# Patient Record
Sex: Female | Born: 1965 | Race: Black or African American | Hispanic: No | Marital: Single | Smoking: Never smoker
Health system: Southern US, Community
[De-identification: ages and names within clinical notes are randomized; demographics above are authoritative.]

## PROBLEM LIST (undated history)

## (undated) DIAGNOSIS — B20 Human immunodeficiency virus [HIV] disease: Secondary | ICD-10-CM

## (undated) DIAGNOSIS — Z21 Asymptomatic human immunodeficiency virus [HIV] infection status: Secondary | ICD-10-CM

## (undated) HISTORY — PX: MANDIBLE SURGERY: SHX707

## (undated) HISTORY — DX: Human immunodeficiency virus (HIV) disease: B20

## (undated) HISTORY — DX: Asymptomatic human immunodeficiency virus (hiv) infection status: Z21

---

## 2008-06-03 HISTORY — PX: BREAST BIOPSY: SHX20

## 2012-06-03 HISTORY — PX: UTERINE FIBROID SURGERY: SHX826

## 2018-02-20 ENCOUNTER — Other Ambulatory Visit: Payer: BLUE CROSS/BLUE SHIELD

## 2018-02-20 ENCOUNTER — Telehealth: Payer: Self-pay | Admitting: Pharmacist

## 2018-02-20 ENCOUNTER — Ambulatory Visit: Payer: BLUE CROSS/BLUE SHIELD

## 2018-02-20 ENCOUNTER — Encounter: Payer: Self-pay | Admitting: Family

## 2018-02-20 ENCOUNTER — Other Ambulatory Visit: Payer: Self-pay | Admitting: Behavioral Health

## 2018-02-20 DIAGNOSIS — B20 Human immunodeficiency virus [HIV] disease: Secondary | ICD-10-CM

## 2018-02-20 DIAGNOSIS — Z113 Encounter for screening for infections with a predominantly sexual mode of transmission: Secondary | ICD-10-CM

## 2018-02-20 DIAGNOSIS — Z79899 Other long term (current) drug therapy: Secondary | ICD-10-CM

## 2018-02-20 MED ORDER — EFAVIRENZ-EMTRICITAB-TENOFOVIR 600-200-300 MG PO TABS: 1.0000 | ORAL_TABLET | Freq: Every day | ORAL | 1 refills | Status: DC

## 2018-02-20 MED FILL — ATRIPLA 600-200-300 MG TABS: 600-200-300 | 30 days supply | Qty: 30 | Fill #0

## 2018-02-20 NOTE — Telephone Encounter (Signed)
Perfect. Thanks.

## 2018-02-20 NOTE — Telephone Encounter (Signed)
Patient came in as a new HIV transfer to get labs and meet with Juliann Pulse.  She has [employer]BS and has been out of Atripla for 10 days due to no doctor being able to send a prescription. Met with her today and will send in 30 days with 1 refill to [facility] to tie her over until she sees Marya Amsler and myself on 10/4.

## 2018-02-23 ENCOUNTER — Ambulatory Visit: Payer: Self-pay

## 2018-02-23 ENCOUNTER — Other Ambulatory Visit: Payer: Self-pay

## 2018-03-06 ENCOUNTER — Encounter: Payer: Self-pay | Admitting: Family

## 2018-03-06 ENCOUNTER — Ambulatory Visit (INDEPENDENT_AMBULATORY_CARE_PROVIDER_SITE_OTHER): Payer: BLUE CROSS/BLUE SHIELD | Admitting: Family

## 2018-03-06 VITALS — BP 127/80 | HR 71 | Temp 97.9°F | Wt 137.0 lb

## 2018-03-06 DIAGNOSIS — B20 Human immunodeficiency virus [HIV] disease: Secondary | ICD-10-CM

## 2018-03-06 MED ORDER — BICTEGRAVIR-EMTRICITAB-TENOFOV 50-200-25 MG PO TABS: 1.0000 | ORAL_TABLET | Freq: Every day | ORAL | 2 refills | Status: DC

## 2018-03-06 MED FILL — BIKTARVY 50-200-25 MG TABS: 50-200-25 | 30 days supply | Qty: 30 | Fill #0

## 2018-03-06 NOTE — Assessment & Plan Note (Signed)
Jamie Cherry has well controlled HIV disease with her current antiretroviral therapy of Atripla. She remains adherent with no adverse side effects. We discussed that there are safer options and it would be ideal to switch her from Atripla. She is in agreement and will change her to [place]. She has no signs/symptoms of opportunistic infection through history or physical exam. Awaiting her immunization records from her previous provider and she will get influenza vaccination at the pharmacy. Repeat viral load in one month. Plan for follow up office visit in 3 months or sooner if needed with blood work 1-2 weeks prior to appointment. She gets her lab work from [facility] with prescription provided.

## 2018-03-06 NOTE — Patient Instructions (Signed)
Nice to meet you.  We will get your ATRIPLA changed to BIKTARVY.  Plan for follow up in 3 months or sooner if needed with lab work 1-2 weeks prior to your appointment.  Please check your viral load after 1 month on Biktarvy.   We will try a 90 day supply at the next refill.

## 2018-03-06 NOTE — Progress Notes (Signed)
Subjective:    Patient ID: Jamie Cherry, female    DOB: Oct 16, 1965, 53 y.o.   MRN: [id number]  Chief Complaint  Patient presents with  . HIV Positive/AIDS    HPI:  Jamie Cherry is a 52 y.o. female who presents today to establish/transfer care for her HIV disease.   Jamie Cherry was initially diagnosed with HIV-1 when living in [place] in 2000 when she as experiencing chronic yeast infections. Since that time she has been on Sustiva and Truvada and about 1.5 years ago was transitioned to Atripla. Previous provider debated changing her to [facility]. She was last seen by her HIV provider in 2018 and has been taking Atripla since. She was without medication for about 2 weeks, but otherwise has been adherent to her Atripla not missing doses. She has no adverse side effects or problems obtaining the medication. She remains covered through [employer] and she works in [employer] currently. Denies fevers, chills, night sweats, headaches, changes in vision, neck pain/stiffness, nausea, diarrhea, vomiting, lesions or rashes.  Jamie Cherry completed blood work on 02/20/18 with a CD4 count of 640 and a viral load that was undetectable. Creatinine was 0.76; HLA-[id number] was negative; Quantiferon Gold was negative; she is immune to Hepatitis B; and Hepatitis C antibody was negative.   Jamie Cherry was initially diagnosed with HIV in [place] in 2000 when she was experiencing chronic yeast infections. She has been on Sustiva and Truvada in the past and then for the last year she has been Atripla. She was switched due to narrowing of therapy. She was last seen in by her HIV provider in 2018.    Allergies  Allergen Reactions  . Vicodin [Hydrocodone-Acetaminophen]     Nausea and vomiting      Outpatient Medications Prior to Visit  Medication Sig Dispense Refill  . efavirenz-emtricitabine-tenofovir (ATRIPLA) 600-200-300 MG tablet Take 1 tablet by mouth at bedtime. 30 tablet 1   No  facility-administered medications prior to visit.      Past Medical History:  Diagnosis Date  . HIV infection [facility])       Past Surgical History:  Procedure Laterality Date  . MANDIBLE SURGERY    . UTERINE FIBROID SURGERY  2014      History reviewed. No pertinent family history.    Social History   Socioeconomic History  . Marital status: Single    Spouse name: Not on file  . Number of children: Not on file  . Years of education: Not on file  . Highest education level: Not on file  Occupational History  . Occupation: Designer, fashion/clothing  . Financial resource strain: Not on file  . Food insecurity:    Worry: Not on file    Inability: Not on file  . Transportation needs:    Medical: Not on file    Non-medical: Not on file  Tobacco Use  . Smoking status: Never Smoker  . Smokeless tobacco: Never Used  Substance and Sexual Activity  . Alcohol use: Never    Frequency: Never  . Drug use: Never  . Sexual activity: Not on file  Lifestyle  . Physical activity:    Days per week: Not on file    Minutes per session: Not on file  . Stress: Not on file  Relationships  . Social connections:    Talks on phone: Not on file    Gets together: Not on file    Attends religious service: Not on file  Active member of club or organization: Not on file    Attends meetings of clubs or organizations: Not on file    Relationship status: Not on file  . Intimate partner violence:    Fear of current or ex partner: Not on file    Emotionally abused: Not on file    Physically abused: Not on file    Forced sexual activity: Not on file  Other Topics Concern  . Not on file  Social History Narrative  . Not on file      Review of Systems  Constitutional: Negative for appetite change, chills, diaphoresis, fatigue, fever and unexpected weight change.  Eyes:       Negative for acute change in vision  Respiratory: Negative for chest tightness, shortness of breath and  wheezing.   Cardiovascular: Negative for chest pain.  Gastrointestinal: Negative for diarrhea, nausea and vomiting.  Genitourinary: Negative for dysuria, pelvic pain and vaginal discharge.  Musculoskeletal: Negative for neck pain and neck stiffness.  Skin: Negative for rash.  Neurological: Negative for seizures, syncope, weakness and headaches.  Hematological: Negative for adenopathy. Does not bruise/bleed easily.  Psychiatric/Behavioral: Negative for hallucinations.         Objective:    BP 127/80   Pulse 71   Temp 97.9 F (36.6 C)   Wt 137 lb (62.1 kg)  Nursing note and vital signs reviewed.  Physical Exam  Constitutional: She is oriented to person, place, and time. She appears well-developed. No distress.  HENT:  Mouth/Throat: Oropharynx is clear and moist.  Eyes: Conjunctivae are normal.  Neck: Neck supple.  Cardiovascular: Normal rate, regular rhythm, normal heart sounds and intact distal pulses. Exam reveals no gallop and no friction rub.  No murmur heard. Pulmonary/Chest: Effort normal and breath sounds normal. No respiratory distress. She has no wheezes. She has no rales. She exhibits no tenderness.  Abdominal: Soft. Bowel sounds are normal. There is no tenderness.  Lymphadenopathy:    She has no cervical adenopathy.  Neurological: She is alert and oriented to person, place, and time.  Skin: Skin is warm and dry. No rash noted.  Psychiatric: She has a normal mood and affect. Her behavior is normal. Judgment and thought content normal.        Assessment & Plan:   Problem List Items Addressed This Visit      Other   HIV disease ([place]) - Primary    Jamie Cherry has well controlled HIV disease with her current antiretroviral therapy of Atripla. She remains adherent with no adverse side effects. We discussed that there are safer options and it would be ideal to switch her from Atripla. She is in agreement and will change her to [place]. She has no signs/symptoms of  opportunistic infection through history or physical exam. Awaiting her immunization records from her previous provider and she will get influenza vaccination at the pharmacy. Repeat viral load in one month. Plan for follow up office visit in 3 months or sooner if needed with blood work 1-2 weeks prior to appointment. She gets her lab work from [facility] with prescription provided.       Relevant Medications   bictegravir-emtricitabine-tenofovir AF (BIKTARVY) 50-200-25 MG TABS tablet       I have discontinued Jamie Cherry's efavirenz-emtricitabine-tenofovir. I am also having her start on bictegravir-emtricitabine-tenofovir AF.   Meds ordered this encounter  Medications  . bictegravir-emtricitabine-tenofovir AF (BIKTARVY) 50-200-25 MG TABS tablet    Sig: Take 1 tablet by mouth daily.  Dispense:  30 tablet    Refill:  2    Order Specific Question:   Supervising Provider    Answer:   Carlyle Basques [[id number]]     Follow-up: Return in about 3 months (around 06/06/2018), or if symptoms worsen or fail to improve.    Terri Piedra, MSN, FNP-C Nurse Practitioner [facility] Office phone: [phone] Pager: [place] number: [phone]

## 2018-03-10 ENCOUNTER — Ambulatory Visit: Payer: Self-pay

## 2018-03-10 ENCOUNTER — Encounter: Payer: Self-pay | Admitting: Family

## 2018-04-02 MED FILL — BIKTARVY 50-200-25 MG TABS: 50-200-25 | 30 days supply | Qty: 30 | Fill #1

## 2018-04-07 ENCOUNTER — Encounter: Payer: Self-pay | Admitting: Family

## 2018-04-27 MED FILL — BIKTARVY 50-200-25 MG TABS: 50-200-25 | 30 days supply | Qty: 30 | Fill #2

## 2018-05-19 ENCOUNTER — Other Ambulatory Visit: Payer: Self-pay | Admitting: Family

## 2018-05-20 ENCOUNTER — Telehealth: Payer: Self-pay

## 2018-05-20 NOTE — Telephone Encounter (Signed)
Called patient after receiving refill request for Biktarvy. Patient needs an appointment for lab work and follow-up appointment with Marcos EkeGreg Calone, Np. Patient was able to take my call and schedule appointments. Patient states she is tolerating medication well and has no complaints.  Lorenso CourierJose L Senta Kantor, [place]

## 2018-05-22 ENCOUNTER — Other Ambulatory Visit: Payer: BLUE CROSS/BLUE SHIELD

## 2018-05-22 MED FILL — BIKTARVY 50-200-25 MG TABS: 50-200-25 | 30 days supply | Qty: 30 | Fill #0

## 2018-06-17 ENCOUNTER — Other Ambulatory Visit: Payer: Self-pay | Admitting: Family

## 2018-06-26 ENCOUNTER — Ambulatory Visit: Payer: BLUE CROSS/BLUE SHIELD | Admitting: Family

## 2018-06-26 ENCOUNTER — Encounter: Payer: Self-pay | Admitting: Family

## 2018-06-26 VITALS — BP 146/92 | HR 77 | Ht 63.0 in | Wt 140.0 lb

## 2018-06-26 DIAGNOSIS — R05 Cough: Secondary | ICD-10-CM

## 2018-06-26 DIAGNOSIS — B20 Human immunodeficiency virus [HIV] disease: Secondary | ICD-10-CM | POA: Diagnosis not present

## 2018-06-26 DIAGNOSIS — R059 Cough, unspecified: Secondary | ICD-10-CM | POA: Insufficient documentation

## 2018-06-26 DIAGNOSIS — Z0000 Encounter for general adult medical examination without abnormal findings: Secondary | ICD-10-CM | POA: Diagnosis not present

## 2018-06-26 MED ORDER — PREDNISONE 20 MG PO TABS: 20.0000 mg | ORAL_TABLET | Freq: Two times a day (BID) | ORAL | 0 refills | Status: DC

## 2018-06-26 MED FILL — predniSONE 20 MG TABS: 20 | 3 days supply | Qty: 6 | Fill #0

## 2018-06-26 MED FILL — BIKTARVY 50-200-25 MG TABS: 50-200-25 | 30 days supply | Qty: 30 | Fill #0

## 2018-06-26 NOTE — Assessment & Plan Note (Signed)
Jamie Cherry has well-controlled HIV disease with good tolerance and adherence to her ART regimen of Biktarvy.  No signs/symptoms of opportunistic infection or progressive HIV disease at present.  Continue current dose of Biktarvy.  Follow-up office visit in 4 months or sooner if needed with lab work 1 to 2 weeks prior to appointment.

## 2018-06-26 NOTE — Progress Notes (Signed)
Subjective:    Patient ID: Jamie Cherry, female    DOB: [birth year], 53 y.o.   MRN: [id number]  Chief Complaint  Patient presents with  . HIV Positive/AIDS  . Cough     HPI:  Jamie Cherry is a 53 y.o. female who presents today for routine follow-up of HIV disease.  Jamie Cherry was last seen in the office on 03/06/2018 to establish/transfer care of HIV disease with good tolerance adherence to her ART regimen of Atripla and was changed to [place].  Initial blood work showed CD4 count of 640 with a viral load that was undetectable.  Most recent blood work completed on 05/22/2018 shows continued viral suppression and remaining undetectable with a CD4 count of 736.  She is due for Pneumovax, Prevnar, Menveo, dental screening, mammogram, and Pap (June 2018).  Jamie Cherry has been taking her Biktarvy as prescribed with no adverse side effects or missed doses.  She continues to remain insured through [facility] [employer] and [facility] problems. 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   She has had a persistent cough that has been going on since November. Initially seen in Urgent Care and was prescribed Tessalon pearles and Azithromycin. Chest x-ray at the time was negative. Describes the cough as non-productive and only has 1-3 episodes per day. Occasionally cough will wake her up at night. Denies fevers, chills, sweats, heart burn, abdominal pain or congestion.    Allergies  Allergen Reactions  . Vicodin [Hydrocodone-Acetaminophen]     Nausea and vomiting      Outpatient Medications Prior to Visit  Medication Sig Dispense Refill  . BIKTARVY 50-200-25 MG TABS tablet TAKE 1 TABLET BY MOUTH DAILY. 30 tablet 3   No facility-administered medications prior to visit.      Past Medical History:  Diagnosis Date  . HIV infection [facility](HCC)      Past Surgical History:    Procedure Laterality Date  . MANDIBLE SURGERY    . UTERINE FIBROID SURGERY  2014     Review of Systems  Constitutional: Negative for appetite change, chills, diaphoresis, fatigue, fever and unexpected weight change.  Eyes:       Negative for acute change in vision  Respiratory: Positive for cough. Negative for chest tightness, shortness of breath and wheezing.   Cardiovascular: Negative for chest pain.  Gastrointestinal: Negative for diarrhea, nausea and vomiting.  Genitourinary: Negative for dysuria, pelvic pain and vaginal discharge.  Musculoskeletal: Negative for neck pain and neck stiffness.  Skin: Negative for rash.  Neurological: Negative for seizures, syncope, weakness and headaches.  Hematological: Negative for adenopathy. Does not bruise/bleed easily.  Psychiatric/Behavioral: Negative for hallucinations.      Objective:    BP (!) 146/92   Pulse 77   Ht 5\' 3"  (1.6 m)   Wt 140 lb (63.5 kg)   LMP 03/03/2018 Comment: Irregular periods  BMI 24.80 kg/m  Nursing note and vital signs reviewed.  Physical Exam Constitutional:      General: She is not in acute distress.    Appearance: She is well-developed.  HENT:     Right Ear: Hearing, tympanic membrane, ear canal and external ear normal.     Left Ear: Hearing, tympanic membrane, ear canal and external ear normal.     Nose:     Right Sinus: No maxillary sinus tenderness or frontal sinus tenderness.     Left Sinus:  No maxillary sinus tenderness or frontal sinus tenderness.     Mouth/Throat:     Lips: Pink.     Mouth: Mucous membranes are moist.     Pharynx: Oropharynx is clear. Uvula midline.  Eyes:     Conjunctiva/sclera: Conjunctivae normal.  Neck:     Musculoskeletal: Neck supple.  Cardiovascular:     Rate and Rhythm: Normal rate and regular rhythm.     Heart sounds: Normal heart sounds. No murmur. No friction rub. No gallop.   Pulmonary:     Effort: Pulmonary effort is normal. No respiratory distress.      Breath sounds: Normal breath sounds. No wheezing, rhonchi or rales.  Chest:     Chest wall: No tenderness.  Abdominal:     General: Bowel sounds are normal.     Palpations: Abdomen is soft.     Tenderness: There is no abdominal tenderness.  Lymphadenopathy:     Cervical: No cervical adenopathy.  Skin:    General: Skin is warm and dry.     Findings: No rash.  Neurological:     Mental Status: She is alert and oriented to person, place, and time.  Psychiatric:        Behavior: Behavior normal.        Thought Content: Thought content normal.        Judgment: Judgment normal.        Assessment & Plan:   Problem List Items Addressed This Visit      Other   HIV disease (HCC) - Primary    Jamie Cherry has well-controlled HIV disease with good tolerance and adherence to her ART regimen of Biktarvy.  No signs/symptoms of opportunistic infection or progressive HIV disease at present.  Continue current dose of Biktarvy.  Follow-up office visit in 4 months or sooner if needed with lab work 1 to 2 weeks prior to appointment.      Cough    Symptoms of cough appear to be consistent with postviral cough syndrome or resolving bronchitis.  There is no indication for antibiotic therapy at this time.  She does not appear to have any symptoms associated with gastroesophageal reflux.  Start prednisone as needed as inflammation may be the cause of her remaining symptoms.  Follow-up if symptoms worsen or do not improve.      Health care maintenance     Declines immunizations today indicating she has had them.  Requested copy of immunization records.  Colonoscopy completed in June 2018 and up-to-date  Working on finding a gynecologist to schedule cervical cancer and breast cancer screenings.  Discussed importance of safe sexual practice to reduce acquisition and transmission of STI.  Dental screening up-to-date and recently seen in the last 3 months.          I am having Karyss Hanken start  on predniSONE. I am also having her maintain her BIKTARVY.   Meds ordered this encounter  Medications  . predniSONE (DELTASONE) 20 MG tablet    Sig: Take 1 tablet (20 mg total) by mouth 2 (two) times daily with a meal.    Dispense:  6 tablet    Refill:  0    Order Specific Question:   Supervising Provider    Answer:   Judyann Munson [[id number]]     Follow-up: Return in about 4 months (around 10/25/2018), or if symptoms worsen or fail to improve.   Marcos Eke, MSN, FNP-C Nurse Practitioner [facility] Office  phone: [phone] Pager: [phone] [facility] Main number: [phone]

## 2018-06-26 NOTE — Assessment & Plan Note (Signed)
   Declines immunizations today indicating she has had them.  Requested copy of immunization records.  Colonoscopy completed in June 2018 and up-to-date  Working on finding a gynecologist to schedule cervical cancer and breast cancer screenings.  Discussed importance of safe sexual practice to reduce acquisition and transmission of STI.  Dental screening up-to-date and recently seen in the last 3 months.

## 2018-06-26 NOTE — Patient Instructions (Signed)
Nice to see you.  We will continue your [place].   Start the prednisone for your cough which may also help with your neck.  Plan for follow up in 4 months or sooner if needed with lab work completed before appointment at [facility].  Have a great day!

## 2018-06-26 NOTE — Assessment & Plan Note (Signed)
Symptoms of cough appear to be consistent with postviral cough syndrome or resolving bronchitis.  There is no indication for antibiotic therapy at this time.  She does not appear to have any symptoms associated with gastroesophageal reflux.  Start prednisone as needed as inflammation may be the cause of her remaining symptoms.  Follow-up if symptoms worsen or do not improve.

## 2018-07-27 MED FILL — BIKTARVY 50-200-25 MG TABS: 50-200-25 | 30 days supply | Qty: 30 | Fill #1

## 2018-08-10 ENCOUNTER — Telehealth: Payer: Self-pay

## 2018-08-10 NOTE — Telephone Encounter (Signed)
Patient called today requesting to speak with Marcos Eke, Np. Patient states she has question that she would like to discuss with NP. Patient did not want to relay question to CMA. Will route message to Tammy Sours to see if he will be able to call patient back. Lorenso Courier, [place]

## 2018-08-10 NOTE — Telephone Encounter (Signed)
Returned call and received voicemail. Message left regarding attempted call.

## 2018-08-11 NOTE — Telephone Encounter (Signed)
Patient called requesting to speak to Marcos Eke directly.  Patient will not leave a message to relay to [place].  She states she is by her phone and will be able to answer today if he calls. Angeline Slim RN

## 2018-08-13 NOTE — Telephone Encounter (Signed)
Spoke with patient and answered questions.

## 2018-08-31 MED FILL — BIKTARVY 50-200-25 MG TABS: 50-200-25 | 30 days supply | Qty: 30 | Fill #2

## 2018-09-07 ENCOUNTER — Other Ambulatory Visit: Payer: Self-pay | Admitting: Obstetrics & Gynecology

## 2018-09-07 DIAGNOSIS — Z1231 Encounter for screening mammogram for malignant neoplasm of breast: Secondary | ICD-10-CM

## 2018-09-08 ENCOUNTER — Encounter: Payer: BLUE CROSS/BLUE SHIELD | Admitting: Obstetrics & Gynecology

## 2018-09-30 MED FILL — BIKTARVY 50-200-25 MG TABS: 50-200-25 | 30 days supply | Qty: 30 | Fill #3

## 2018-10-29 ENCOUNTER — Encounter: Payer: BLUE CROSS/BLUE SHIELD | Admitting: Obstetrics & Gynecology

## 2018-10-30 ENCOUNTER — Other Ambulatory Visit: Payer: Self-pay | Admitting: Family

## 2018-10-30 ENCOUNTER — Other Ambulatory Visit: Payer: Self-pay | Admitting: Pharmacist

## 2018-10-30 DIAGNOSIS — B20 Human immunodeficiency virus [HIV] disease: Secondary | ICD-10-CM

## 2018-10-30 MED ORDER — BICTEGRAVIR-EMTRICITAB-TENOFOV 50-200-25 MG PO TABS: 1.0000 | ORAL_TABLET | Freq: Every day | ORAL | 3 refills | Status: DC

## 2018-10-30 MED FILL — BIKTARVY 50-200-25 MG TABS: 50-200-25 | 30 days supply | Qty: 30 | Fill #0

## 2018-10-30 NOTE — Progress Notes (Signed)
Sending in [place] refills so [employer] can mail to patient.

## 2018-11-17 ENCOUNTER — Telehealth: Payer: Self-pay | Admitting: Radiology

## 2018-11-17 NOTE — Telephone Encounter (Signed)
Left message for patient to call [facility] for New Gyn/ Annual Exam with Dr Hulan Fray

## 2018-11-26 ENCOUNTER — Ambulatory Visit
Admission: EM | Admit: 2018-11-26 | Discharge: 2018-11-26 | Disposition: A | Discharge status: Home / Self Care | Payer: BC Managed Care – PPO | Attending: Emergency Medicine | Admitting: Emergency Medicine

## 2018-11-26 ENCOUNTER — Encounter: Payer: Self-pay | Admitting: Emergency Medicine

## 2018-11-26 ENCOUNTER — Other Ambulatory Visit: Payer: Self-pay

## 2018-11-26 DIAGNOSIS — R2231 Localized swelling, mass and lump, right upper limb: Secondary | ICD-10-CM

## 2018-11-26 DIAGNOSIS — L02411 Cutaneous abscess of right axilla: Secondary | ICD-10-CM

## 2018-11-26 MED FILL — BIKTARVY 50-200-25 MG TABS: 50-200-25 | 30 days supply | Qty: 30 | Fill #1

## 2018-11-26 NOTE — ED Provider Notes (Signed)
HPI  SUBJECTIVE:  Jamie Cherry is a 53 y.o. female who presents with painful,  mass in her right axilla starting 5 days ago.  She describes the pain as throbbing, constant soreness, but states that it is getting much better.  She states that it was erythematous and soft in the middle initially, and now has turned into a small, hard mass.  States that erythema has resolved, and that it is getting much smaller.  She does shave her axilla.  No trauma, bug bite, fevers, body aches, nausea, vomiting, expressible purulent drainage, other axillary masses.  No breast masses, skin changes, nipple discharge.  She does do breast self exams.  No aggravating or alleviating factors.  She has not tried anything for this.  She has a past medical history HIV, CD4 700, viral load undetectable.  No history of MRSA, diabetes, breast cancer although she has a mammogram scheduled for next week.  No history of abscess, hirdadenitis suppurativa.  Family history significant for sister with breast cancer.  PMD: None.   Past Medical History:  Diagnosis Date  . HIV infection [facility])     Past Surgical History:  Procedure Laterality Date  . MANDIBLE SURGERY    . UTERINE FIBROID SURGERY  2014    Family History  Problem Relation Age of Onset  . Hypertension Mother   . Heart disease Father   . Hypertension Father     Social History   Tobacco Use  . Smoking status: Never Smoker  . Smokeless tobacco: Never Used  Substance Use Topics  . Alcohol use: Never    Frequency: Never  . Drug use: Never    No current facility-administered medications for this encounter.   Current Outpatient Medications:  .  bictegravir-emtricitabine-tenofovir AF (BIKTARVY) 50-200-25 MG TABS tablet, Take 1 tablet by mouth daily., Disp: 30 tablet, Rfl: 3  Allergies  Allergen Reactions  . Vicodin [Hydrocodone-Acetaminophen]     Nausea and vomiting     ROS  As noted in HPI.   Physical Exam  BP (!) 143/86 (BP Location: Right Arm)    Pulse 65   Temp 98.1 F (36.7 C) (Oral)   Resp 18   Ht 5\' 3"  (1.6 m)   Wt 67.1 kg   LMP 03/03/2018 Comment: Irregular periods  SpO2 100%   BMI 26.22 kg/m   Constitutional: Well developed, well nourished, no acute distress Eyes:  EOMI, conjunctiva normal bilaterally HENT: Normocephalic, atraumatic,mucus membranes moist Respiratory: Normal inspiratory effort Rest: Normal appearance, breast symmetric.  No palpable masses bilaterally.  No skin changes, expressible nipple drainage bilaterally Cardiovascular: Normal rate GI: nondistended skin: Right axilla normal skin.  No erythema.  No evidence of trauma.  +0.5 cm tender mass along the inferior part of the axilla.  No expressible purulent drainage.  No other axillary masses. Lymph: No supraclavicular lymphadenopathy. Musculoskeletal: no deformities Neurologic: Alert & oriented x 3, no focal neuro deficits Psychiatric: Speech and behavior appropriate   ED Course   Medications - No data to display  No orders of the defined types were placed in this encounter.   No results found for this or any previous visit (from the past 24 hour(s)). No results found.  ED Clinical Impression  1. Mass of right axilla   2. Abscess of axilla, right      ED Assessment/Plan  Given the history, I suspect a healing axillary abscess although it could be an lymph node.  However I think this is much less likely.  There were  no other appreciable axillary or supraclavicular, breast masses bilaterally.  She has a mammogram scheduled for next week.  We talked about antibiotics, but given the absence of erythema, fact that it is getting significantly better, we decided to forego them today.  She is to continue warm compresses.  Follow-up with a primary care physician of her choice, providing primary care list. Discussed  MDM, treatment plan, and plan for follow-up with patient.patient agrees with plan.   No orders of the defined types were placed in this  encounter.   *This clinic note was created using Dragon dictation software. Therefore, there may be occasional mistakes despite careful proofreading.   ?   Domenick GongMortenson, Jamie Navarrette, MD 11/26/18 1140

## 2018-11-26 NOTE — Discharge Instructions (Signed)
Suspect that this was an abscess rather than a lymph node.  Does not seem to be anything to do today.  It does not need drainage, and as we discussed, I do not think that this needs antibiotics.  However, your mammogram next week will give [place] more information as to whether it could be a lymph node.  You may do warm compresses to the area to help speed healing.  Return here if your symptoms return.  Follow-up with a primary care physician as soon as you possibly can.  See list below.  Here is a list of primary care providers who are taking new patients:  Dr. Otilio Miu, Dr. Adline Potter [address] [place] [address] [place] [address]  [phone]  [facility] [address] [place], [place] [address] [phone]  [facility] [place]  [phone] [place], [place] [address]  Here are clinics/ other resources who will see you if you do not have insurance. Some have certain criteria that you must meet. Call them and find out what they are:  [facility]: [address], [place], [place] [address] Phone: [phone] Hours: First and Third Saturdays of each Month, 9 a.m. - 1 p.m.  [facility]: [address] [place], [place], [place] [address] Phone: [phone] Hours: Tuesday, 4 p.m. - 8 p.m. Thursday, 1 p.m. - 8 p.m. Wednesday, 9 a.m. - [facility] [address] [place], [place] [address] Phone: [phone] Pharmacy Phone Number: [phone] Dental Phone Number: [phone] [place] Help: [phone]  Dental Hours: Monday - Thursday, 8 a.m. - 6 p.m.  [facility] [address] [place], [place] [address] Phone: [phone] Pharmacy Phone Number: [phone] [facility] Insurance Help: [phone]  [facility] [address] [place], [place] [address] Phone:  [phone] Pharmacy Phone Number: [phone] [facility] Insurance Help: [phone]  [facility] [address] [place], [place] [address] Phone: [phone] [facility] Insurance Help: [phone]   [place] [place],  [address] Phone: [phone]  Go to www.goodrx.com to look up your medications. This will give you a list of where you can find your prescriptions at the most affordable prices. Or ask the pharmacist what the cash price is, or if they have any other discount programs available to help make your medication more affordable. This can be less expensive than what you would pay with insurance.

## 2018-11-26 NOTE — ED Triage Notes (Signed)
Pt c/o abscess under right arm. Started about 4 days ago. No drainage. No fevers

## 2018-12-03 ENCOUNTER — Other Ambulatory Visit: Payer: Self-pay

## 2018-12-03 ENCOUNTER — Ambulatory Visit
Admission: RE | Admit: 2018-12-03 | Discharge: 2018-12-03 | Disposition: A | Discharge status: Home / Self Care | Payer: BC Managed Care – PPO | Source: Ambulatory Visit | Attending: Obstetrics & Gynecology | Admitting: Obstetrics & Gynecology

## 2018-12-03 DIAGNOSIS — Z1231 Encounter for screening mammogram for malignant neoplasm of breast: Secondary | ICD-10-CM | POA: Insufficient documentation

## 2018-12-21 ENCOUNTER — Telehealth: Payer: Self-pay

## 2018-12-24 ENCOUNTER — Encounter: Payer: Self-pay | Admitting: Family

## 2018-12-25 MED FILL — BIKTARVY 50-200-25 MG TABS: 50-200-25 | 30 days supply | Qty: 30 | Fill #2

## 2018-12-31 NOTE — Telephone Encounter (Signed)
Patient checking for refill location.

## 2019-01-12 ENCOUNTER — Other Ambulatory Visit: Payer: Self-pay

## 2019-01-12 ENCOUNTER — Encounter: Payer: Self-pay | Admitting: Family

## 2019-01-12 ENCOUNTER — Ambulatory Visit (INDEPENDENT_AMBULATORY_CARE_PROVIDER_SITE_OTHER): Payer: BC Managed Care – PPO | Admitting: Family

## 2019-01-12 DIAGNOSIS — Z0000 Encounter for general adult medical examination without abnormal findings: Secondary | ICD-10-CM

## 2019-01-12 DIAGNOSIS — B20 Human immunodeficiency virus [HIV] disease: Secondary | ICD-10-CM

## 2019-01-12 DIAGNOSIS — R05 Cough: Secondary | ICD-10-CM

## 2019-01-12 DIAGNOSIS — R059 Cough, unspecified: Secondary | ICD-10-CM

## 2019-01-12 MED ORDER — BIKTARVY 50-200-25 MG PO TABS: 1.0000 | ORAL_TABLET | Freq: Every day | ORAL | 2 refills | Status: DC

## 2019-01-12 NOTE — Assessment & Plan Note (Signed)
Continues to have occasional cough with differentials including gastroesophageal reflux or possible sleep apnea.  Recommend over-the-counter medications as needed for symptom relief and supportive care as well as dietary changes.  If symptoms worsen or do not improve consider referral to gastroenterology or for sleep study.

## 2019-01-12 NOTE — Patient Instructions (Signed)
Nice to speak with you today.  Please continue take your Independence as prescribed daily.  Refills of medication have been sent to the pharmacy.  Recommend trying antacids when having episodes of coughing as this may be related to gastroesophageal reflux.  If not improved we may need to consider referral to pulmonology or gastroenterology for further evaluation.  Recommend vaccination for influenza in September when available.  Plan for follow-up in 6 months or sooner if needed with lab work 1 to 2 weeks prior to appointment.

## 2019-01-12 NOTE — Progress Notes (Signed)
Subjective:    Patient ID: Jamie Cherry, female    DOB: Aug 31, 1965, 53 y.o.   MRN: [id number]  Chief Complaint  Patient presents with  . HIV Positive/AIDS     Virtual Visit via Telephone Note   I connected with [facility] on 01/12/2019 at 4:39 pm by telephone and verified that I am speaking with the correct person using two identifiers.   I discussed the limitations, risks, security and privacy concerns of performing an evaluation and management service by telephone and the availability of in person appointments. I also discussed with the patient that there may be a patient responsible charge related to this service. The patient expressed understanding and agreed to proceed.   HPI:  Jamie Cherry is a 53 y.o. female with HIV disease who was last seen in the office on 06/26/2018 with good adherence and tolerance to her ART of Biktarvy. Most recent blood work completed on 12/24/2018 shows a viral load that is undetectable with CD4 count of 789.  Kidney function, liver function, electrolytes within normal ranges.  RPR was nonreactive.  Lipid profile with LDL of 144, triglycerides 92, and HDL of 72.  Jamie Cherry continues to take her Biktarvy as prescribed no adverse side effects or missed doses.  Overall feeling very well although does have the occasional cough at times. Denies fevers, chills, night sweats, headaches, changes in vision, neck pain/stiffness, nausea, diarrhea, vomiting, lesions or rashes.  Jamie Cherry continues to have covered through [facility] and has no problems obtaining her medication from the pharmacy which are currently mailed.  Denies feelings of being down, depressed, or hopeless recently.  Continues to work from home.  No recreational or illicit drug use, tobacco use, or alcohol consumption.  Not currently sexually active at this time.   Allergies  Allergen Reactions  . Vicodin [Hydrocodone-Acetaminophen]     Nausea and vomiting      Outpatient  Medications Prior to Visit  Medication Sig Dispense Refill  . bictegravir-emtricitabine-tenofovir AF (BIKTARVY) 50-200-25 MG TABS tablet Take 1 tablet by mouth daily. 30 tablet 3   No facility-administered medications prior to visit.      Past Medical History:  Diagnosis Date  . HIV infection [facility])      Past Surgical History:  Procedure Laterality Date  . BREAST BIOPSY Left 2010   neg  . MANDIBLE SURGERY    . UTERINE FIBROID SURGERY  2014       Review of Systems  Constitutional: Negative for appetite change, chills, diaphoresis, fatigue, fever and unexpected weight change.  Eyes:       Negative for acute change in vision  Respiratory: Negative for chest tightness, shortness of breath and wheezing.   Cardiovascular: Negative for chest pain.  Gastrointestinal: Negative for diarrhea, nausea and vomiting.  Genitourinary: Negative for dysuria, pelvic pain and vaginal discharge.  Musculoskeletal: Negative for neck pain and neck stiffness.  Skin: Negative for rash.  Neurological: Negative for seizures, syncope, weakness and headaches.  Hematological: Negative for adenopathy. Does not bruise/bleed easily.  Psychiatric/Behavioral: Negative for hallucinations.      Objective:    Nursing note and vital signs reviewed.    [name] sounds to be doing very well and is a pleasure to speak with.  Remaining physical exam deferred due to phone visit. Assessment & Plan:   Problem List Items Addressed This Visit      Other   HIV disease ([place])    Jamie Cherry has well-controlled HIV disease with good  adherence and tolerance to her ART regimen of Biktarvy.  No signs/symptoms of opportunistic infection or progressive HIV disease.  She has no problems obtaining her medication from the pharmacy.  Continue current dose of Biktarvy.  Plan for follow-up in 6 months or sooner if needed with lab work 1 to 2 weeks prior to appointment through [facility].      Relevant Medications    bictegravir-emtricitabine-tenofovir AF (BIKTARVY) 50-200-25 MG TABS tablet   Cough    Continues to have occasional cough with differentials including gastroesophageal reflux or possible sleep apnea.  Recommend over-the-counter medications as needed for symptom relief and supportive care as well as dietary changes.  If symptoms worsen or do not improve consider referral to gastroenterology or for sleep study.      Health care maintenance - Primary     Dental screening remains up-to-date  Working on gynecology appointment to complete cervical and breast cancer screenings.  Discussed importance of safe sexual practice to reduce risk of acquisition/transmission of STI.          I am having Jamie Cherry maintain her Biktarvy.   Meds ordered this encounter  Medications  . bictegravir-emtricitabine-tenofovir AF (BIKTARVY) 50-200-25 MG TABS tablet    Sig: Take 1 tablet by mouth daily.    Dispense:  90 tablet    Refill:  2    Order Specific Question:   Supervising Provider    Answer:   Judyann MunsonSNIDER, CYNTHIA [phone][[id number]]     I discussed the assessment and treatment plan with the patient. The patient was provided an opportunity to ask questions and all were answered. The patient agreed with the plan and demonstrated an understanding of the instructions.   The patient was advised to call back or seek an in-person evaluation if the symptoms worsen or if the condition fails to improve as anticipated.   I provided  18  minutes of non-face-to-face time during this encounter.  Follow-up: Return in about 6 months (around 07/15/2019).   Marcos EkeGreg Calone, MSN, FNP-C Nurse Practitioner [facility] Main number: [phone]

## 2019-01-12 NOTE — Assessment & Plan Note (Signed)
   Dental screening remains up-to-date  Working on gynecology appointment to complete cervical and breast cancer screenings.  Discussed importance of safe sexual practice to reduce risk of acquisition/transmission of STI.

## 2019-01-12 NOTE — Assessment & Plan Note (Signed)
Ms. Pestka has well-controlled HIV disease with good adherence and tolerance to her ART regimen of Biktarvy.  No signs/symptoms of opportunistic infection or progressive HIV disease.  She has no problems obtaining her medication from the pharmacy.  Continue current dose of Biktarvy.  Plan for follow-up in 6 months or sooner if needed with lab work 1 to 2 weeks prior to appointment through [employer].

## 2019-01-21 MED FILL — BIKTARVY 50-200-25 MG TABS: 50-200-25 | 30 days supply | Qty: 30 | Fill #0

## 2019-02-23 MED FILL — BIKTARVY 50-200-25 MG TABS: 50-200-25 | 30 days supply | Qty: 30 | Fill #1

## 2019-03-19 MED FILL — BIKTARVY 50-200-25 MG TABS: 50-200-25 | 30 days supply | Qty: 30 | Fill #2

## 2019-04-19 MED FILL — BIKTARVY 50-200-25 MG TABS: 50-200-25 | 30 days supply | Qty: 30 | Fill #3

## 2019-05-31 MED FILL — BIKTARVY 50-200-25 MG TABS: 50-200-25 | 30 days supply | Qty: 30 | Fill #4

## 2019-06-28 MED FILL — BIKTARVY 50-200-25 MG TABS: 50-200-25 | 30 days supply | Qty: 30 | Fill #5

## 2019-06-29 ENCOUNTER — Telehealth: Payer: Self-pay

## 2019-06-29 NOTE — Telephone Encounter (Signed)
Patient requesting hard script for labs she will have her labs drawn at [facility]. Patient scheduled for future office visit in early March. Patient will come by the office and pick up script [facility]

## 2019-07-26 MED FILL — BIKTARVY 50-200-25 MG TABS: 50-200-25 | 30 days supply | Qty: 30 | Fill #6

## 2019-08-06 ENCOUNTER — Other Ambulatory Visit: Payer: Self-pay

## 2019-08-06 ENCOUNTER — Encounter: Payer: Self-pay | Admitting: Family

## 2019-08-06 ENCOUNTER — Ambulatory Visit: Payer: BC Managed Care – PPO | Admitting: Family

## 2019-08-06 VITALS — BP 158/94 | HR 75 | Wt 153.0 lb

## 2019-08-06 DIAGNOSIS — Z0000 Encounter for general adult medical examination without abnormal findings: Secondary | ICD-10-CM | POA: Diagnosis not present

## 2019-08-06 DIAGNOSIS — B20 Human immunodeficiency virus [HIV] disease: Secondary | ICD-10-CM

## 2019-08-06 MED ORDER — VALACYCLOVIR HCL 1 G PO TABS: ORAL_TABLET | ORAL | 1 refills | Status: DC

## 2019-08-06 MED ORDER — BIKTARVY 50-200-25 MG PO TABS: 1.0000 | ORAL_TABLET | Freq: Every day | ORAL | 5 refills | Status: DC

## 2019-08-06 MED FILL — valACYclovir HCL 1 GM TABS: 1 | 15 days supply | Qty: 30 | Fill #0

## 2019-08-06 NOTE — Assessment & Plan Note (Signed)
Jamie Cherry has well-controlled HIV disease with good adherence and tolerance to her ART regimen of Biktarvy.  No signs/symptoms of opportunistic infection or progressive HIV disease.  She has no problems obtaining her medication from the pharmacy.  We reviewed her lab work and discussed the plan of care.  Introduced the long-acting injectable medications as well as SOLAR study information.  Continue current dose of Biktarvy.  Plan for follow-up in 6 months or sooner if needed with lab work 1 to 2 weeks prior to appointment.

## 2019-08-06 NOTE — Assessment & Plan Note (Signed)
   Check vaccination records.  Discussed importance of safe sexual practice to reduce risk of STI.  Condoms provided.

## 2019-08-06 NOTE — Patient Instructions (Addendum)
Nice to see you.  We will send refills of your Biktarvy and Valtrex to the pharmacy.  Please review the information on the Solar study and [place].  [email or web address]  Plan for follow up in 6 months or sooner if needed with lab work at [facility].   Have a great day and stay safe!

## 2019-08-06 NOTE — Progress Notes (Signed)
Subjective:    Patient ID: Jamie Cherry, female    DOB: 1965/12/30, 54 y.o.   MRN: [id number]  Chief Complaint  Patient presents with  . HIV Positive/AIDS     HPI:  Jamie Cherry is a 54 y.o. female with HIV disease who was last seen in the office on 01/12/2019 with good adherence and tolerance to her ART regimen of Biktarvy.  Her viral load was undetectable with CD4 count of 789. Most recent blood work completed on 07/09/19 with CD4 count of 666 with a viral load that is undetectable. Kidney function, liver function and electrolytes within normal ranges.   Jamie Cherry continues to take her Biktarvy as prescribed with no adverse side effects or missed doses since her last office visit.  Overall feeling well today with no new concerns/complaints.  Would like a refill of her valacyclovir. Denies fevers, chills, night sweats, headaches, changes in vision, neck pain/stiffness, nausea, diarrhea, vomiting, lesions or rashes.  Jamie Cherry has no problems obtaining her medication from the pharmacy remains covered through [facility].  No recent feelings of being down, depressed, or hopeless.  No recreational or illicit drug use, tobacco use, or alcohol consumption.  Continues to work full-time.       Allergies  Allergen Reactions  . Vicodin [Hydrocodone-Acetaminophen]     Nausea and vomiting      Outpatient Medications Prior to Visit  Medication Sig Dispense Refill  . bictegravir-emtricitabine-tenofovir AF (BIKTARVY) 50-200-25 MG TABS tablet Take 1 tablet by mouth daily. 90 tablet 2   No facility-administered medications prior to visit.     Past Medical History:  Diagnosis Date  . HIV infection [facility])      Past Surgical History:  Procedure Laterality Date  . BREAST BIOPSY Left 2010   neg  . MANDIBLE SURGERY    . UTERINE FIBROID SURGERY  2014       Review of Systems  Constitutional: Negative for appetite change, chills, diaphoresis, fatigue, fever and  unexpected weight change.  Eyes:       Negative for acute change in vision  Respiratory: Negative for chest tightness, shortness of breath and wheezing.   Cardiovascular: Negative for chest pain.  Gastrointestinal: Negative for diarrhea, nausea and vomiting.  Genitourinary: Negative for dysuria, pelvic pain and vaginal discharge.  Musculoskeletal: Negative for neck pain and neck stiffness.  Skin: Negative for rash.  Neurological: Negative for seizures, syncope, weakness and headaches.  Hematological: Negative for adenopathy. Does not bruise/bleed easily.  Psychiatric/Behavioral: Negative for hallucinations.      Objective:    BP (!) 158/94   Pulse 75   Wt 153 lb (69.4 kg)   BMI 27.10 kg/m  Nursing note and vital signs reviewed.  Physical Exam Constitutional:      General: She is not in acute distress.    Appearance: She is well-developed.  Eyes:     Conjunctiva/sclera: Conjunctivae normal.  Cardiovascular:     Rate and Rhythm: Normal rate and regular rhythm.     Heart sounds: Normal heart sounds. No murmur. No friction rub. No gallop.   Pulmonary:     Effort: Pulmonary effort is normal. No respiratory distress.     Breath sounds: Normal breath sounds. No wheezing or rales.  Chest:     Chest wall: No tenderness.  Abdominal:     General: Bowel sounds are normal.     Palpations: Abdomen is soft.     Tenderness: There is no abdominal tenderness.  Musculoskeletal:  Cervical back: Neck supple.  Lymphadenopathy:     Cervical: No cervical adenopathy.  Skin:    General: Skin is warm and dry.     Findings: No rash.  Neurological:     Mental Status: She is alert and oriented to person, place, and time.  Psychiatric:        Behavior: Behavior normal.        Thought Content: Thought content normal.        Judgment: Judgment normal.      Depression screen [facility] 2/9 08/06/2019 06/26/2018 03/06/2018  Decreased Interest 0 0 0  Down, Depressed, Hopeless 0 0 0  PHQ - 2 Score 0  0 0       Assessment & Plan:    Patient Active Problem List   Diagnosis Date Noted  . Cough 06/26/2018  . Health care maintenance 06/26/2018  . HIV disease ([place]) 03/06/2018     Problem List Items Addressed This Visit      Other   HIV disease ([place]) - Primary    Jamie Cherry has well-controlled HIV disease with good adherence and tolerance to her ART regimen of Biktarvy.  No signs/symptoms of opportunistic infection or progressive HIV disease.  She has no problems obtaining her medication from the pharmacy.  We reviewed her lab work and discussed the plan of care.  Introduced the long-acting injectable medications as well as SOLAR study information.  Continue current dose of Biktarvy.  Plan for follow-up in 6 months or sooner if needed with lab work 1 to 2 weeks prior to appointment.      Relevant Medications   bictegravir-emtricitabine-tenofovir AF (BIKTARVY) 50-200-25 MG TABS tablet   valACYclovir (VALTREX) 1000 MG tablet   Health care maintenance     Check vaccination records.  Discussed importance of safe sexual practice to reduce risk of STI.  Condoms provided.          I am having Jamie Cherry start on valACYclovir. I am also having her maintain her Biktarvy.   Meds ordered this encounter  Medications  . bictegravir-emtricitabine-tenofovir AF (BIKTARVY) 50-200-25 MG TABS tablet    Sig: Take 1 tablet by mouth daily.    Dispense:  30 tablet    Refill:  5    Order Specific Question:   Supervising Provider    Answer:   Carlyle Basques [[id number]]  . valACYclovir (VALTREX) 1000 MG tablet    Sig: Take 1 tablet by mouth twice daily for 7 days as needed for outbreaks    Dispense:  30 tablet    Refill:  1    Order Specific Question:   Supervising Provider    Answer:   Carlyle Basques [[id number]]     Follow-up: Return in about 6 months (around 02/06/2020), or if symptoms worsen or fail to improve.   Terri Piedra, MSN, FNP-C Nurse Practitioner [facility] for Infectious  Disease [place] number: [phone]

## 2019-08-23 MED FILL — BIKTARVY 50-200-25 MG TABS: 50-200-25 | 30 days supply | Qty: 30 | Fill #0

## 2019-09-30 MED FILL — BIKTARVY 50-200-25 MG TABS: 50-200-25 | 30 days supply | Qty: 30 | Fill #7

## 2019-10-18 ENCOUNTER — Telehealth: Payer: Self-pay

## 2019-10-18 NOTE — Telephone Encounter (Signed)
Patient called office today requesting to speak with Marcos Eke, FNP with questions. Would like to know what his advise is on new mask guidelines. Would also like to speak with FNP regarding reaction she was told for Ibuprofen and Biktarvy.   Lorenso Courier, [place]

## 2019-10-20 NOTE — Telephone Encounter (Signed)
Patient called to follow up on previous call to provider. Will forward to provider.   Merla Sawka Loyola Mast, RN

## 2019-10-21 NOTE — Telephone Encounter (Signed)
Spoke with Jamie Cherry regarding her questions about the updated mask policy and also interactions between University and ibuprofen.

## 2019-11-02 MED FILL — BIKTARVY 50-200-25 MG TABS: 50-200-25 | 30 days supply | Qty: 30 | Fill #8

## 2019-11-29 MED FILL — BIKTARVY 50-200-25 MG TABS: 50-200-25 | 30 days supply | Qty: 30 | Fill #1

## 2020-01-17 ENCOUNTER — Other Ambulatory Visit: Payer: Self-pay | Admitting: Internal Medicine

## 2020-01-17 DIAGNOSIS — Z1231 Encounter for screening mammogram for malignant neoplasm of breast: Secondary | ICD-10-CM

## 2020-01-18 MED FILL — BIKTARVY 50-200-25 MG TABS: 50-200-25 | 30 days supply | Qty: 30 | Fill #2

## 2020-02-03 ENCOUNTER — Encounter (INDEPENDENT_AMBULATORY_CARE_PROVIDER_SITE_OTHER): Payer: Self-pay

## 2020-02-03 ENCOUNTER — Other Ambulatory Visit: Payer: Self-pay

## 2020-02-03 ENCOUNTER — Ambulatory Visit
Admission: RE | Admit: 2020-02-03 | Discharge: 2020-02-03 | Disposition: A | Discharge status: Home / Self Care | Payer: BC Managed Care – PPO | Source: Ambulatory Visit | Attending: Internal Medicine | Admitting: Internal Medicine

## 2020-02-03 DIAGNOSIS — Z1231 Encounter for screening mammogram for malignant neoplasm of breast: Secondary | ICD-10-CM | POA: Diagnosis not present

## 2020-02-11 ENCOUNTER — Ambulatory Visit: Payer: BC Managed Care – PPO | Admitting: Family

## 2020-02-18 MED FILL — BIKTARVY 50-200-25 MG TABS: 50-200-25 | 30 days supply | Qty: 30 | Fill #3

## 2020-03-09 ENCOUNTER — Other Ambulatory Visit: Payer: Self-pay

## 2020-03-09 ENCOUNTER — Ambulatory Visit (INDEPENDENT_AMBULATORY_CARE_PROVIDER_SITE_OTHER): Payer: BC Managed Care – PPO | Admitting: Family

## 2020-03-09 ENCOUNTER — Other Ambulatory Visit (HOSPITAL_COMMUNITY): Payer: Self-pay | Admitting: Family

## 2020-03-09 ENCOUNTER — Encounter: Payer: Self-pay | Admitting: Family

## 2020-03-09 VITALS — HR 78 | Wt 149.0 lb

## 2020-03-09 DIAGNOSIS — Z79899 Other long term (current) drug therapy: Secondary | ICD-10-CM

## 2020-03-09 DIAGNOSIS — B20 Human immunodeficiency virus [HIV] disease: Secondary | ICD-10-CM

## 2020-03-09 DIAGNOSIS — Z113 Encounter for screening for infections with a predominantly sexual mode of transmission: Secondary | ICD-10-CM

## 2020-03-09 DIAGNOSIS — Z0000 Encounter for general adult medical examination without abnormal findings: Secondary | ICD-10-CM | POA: Diagnosis not present

## 2020-03-09 MED ORDER — BIKTARVY 50-200-25 MG PO TABS: 1.0000 | ORAL_TABLET | Freq: Every day | ORAL | 5 refills | Status: DC

## 2020-03-09 NOTE — Progress Notes (Signed)
Subjective:    Patient ID: Jamie Cherry, female    DOB: 1966-05-19, 54 y.o.   MRN: [id number]  Chief Complaint  Patient presents with  . Follow-up    No questions or concerns     HPI:  Jamie Cherry is a 54 y.o. female with HIV disease was last seen in the office on 08/06/2019 with good adherence and tolerance to her ART regimen of Biktarvy.  Viral load at the time was undetectable with CD4 count of 666.  Here today for routine follow-up.  Jamie Cherry continues to take her Biktarvy daily as prescribed with no adverse side effects or missed doses since her last office visit.  Overall feeling well today with no new concerns/complaints.  She is interested in receiving her booster shot for COVID-19. Denies fevers, chills, night sweats, headaches, changes in vision, neck pain/stiffness, nausea, diarrhea, vomiting, lesions or rashes.  Jamie Cherry has no problems obtaining her medication from the pharmacy and remains covered through [facility].  Denies feelings of being down, depressed, or hopeless.  No recreational or illicit drug use, tobacco use, or alcohol consumption.  Declines condoms today.  Mammogram is up-to-date.  Has received shingles vaccination.  Due for routine dental care.   Allergies  Allergen Reactions  . Vicodin [Hydrocodone-Acetaminophen]     Nausea and vomiting      Outpatient Medications Prior to Visit  Medication Sig Dispense Refill  . bictegravir-emtricitabine-tenofovir AF (BIKTARVY) 50-200-25 MG TABS tablet Take 1 tablet by mouth daily. 30 tablet 5  . valACYclovir (VALTREX) 1000 MG tablet Take 1 tablet by mouth twice daily for 7 days as needed for outbreaks (Patient not taking: Reported on 03/09/2020) 30 tablet 1   No facility-administered medications prior to visit.     Past Medical History:  Diagnosis Date  . HIV infection [facility])      Past Surgical History:  Procedure Laterality Date  . BREAST BIOPSY Left 2010   neg  . MANDIBLE SURGERY      . UTERINE FIBROID SURGERY  2014    Review of Systems  Constitutional: Negative for appetite change, chills, diaphoresis, fatigue, fever and unexpected weight change.  Eyes:       Negative for acute change in vision  Respiratory: Negative for chest tightness, shortness of breath and wheezing.   Cardiovascular: Negative for chest pain.  Gastrointestinal: Negative for diarrhea, nausea and vomiting.  Genitourinary: Negative for dysuria, pelvic pain and vaginal discharge.  Musculoskeletal: Negative for neck pain and neck stiffness.  Skin: Negative for rash.  Neurological: Negative for seizures, syncope, weakness and headaches.  Hematological: Negative for adenopathy. Does not bruise/bleed easily.  Psychiatric/Behavioral: Negative for hallucinations.      Objective:    Pulse 78   Wt 149 lb (67.6 kg)   LMP 03/03/2018 Comment: Irregular periods  SpO2 100%   BMI 26.39 kg/m  Nursing note and vital signs reviewed.  Physical Exam Constitutional:      General: She is not in acute distress.    Appearance: She is well-developed.  Eyes:     Conjunctiva/sclera: Conjunctivae normal.  Cardiovascular:     Rate and Rhythm: Normal rate and regular rhythm.     Heart sounds: Normal heart sounds. No murmur heard.  No friction rub. No gallop.   Pulmonary:     Effort: Pulmonary effort is normal. No respiratory distress.     Breath sounds: Normal breath sounds. No wheezing or rales.  Chest:     Chest wall: No tenderness.  Abdominal:     General: Bowel sounds are normal.     Palpations: Abdomen is soft.     Tenderness: There is no abdominal tenderness.  Musculoskeletal:     Cervical back: Neck supple.  Lymphadenopathy:     Cervical: No cervical adenopathy.  Skin:    General: Skin is warm and dry.     Findings: No rash.  Neurological:     Mental Status: She is alert and oriented to person, place, and time.  Psychiatric:        Behavior: Behavior normal.        Thought Content: Thought  content normal.        Judgment: Judgment normal.      Depression screen [facility] 2/9 03/09/2020 08/06/2019 06/26/2018 03/06/2018  Decreased Interest 0 0 0 0  Down, Depressed, Hopeless 0 0 0 0  PHQ - 2 Score 0 0 0 0       Assessment & Plan:    Patient Active Problem List   Diagnosis Date Noted  . Cough 06/26/2018  . Health care maintenance 06/26/2018  . HIV disease (HCC) 03/06/2018     Problem List Items Addressed This Visit      Other   HIV disease (HCC)    [name] continues to have well-controlled HIV disease with good adherence and tolerance to her ART regimen of Biktarvy.  No signs/symptoms of opportunistic infection or progressive HIV disease.  We reviewed previous lab work and discussed the plan of care.  She will obtain new lab work from [facility].  Continue current dose of [employer].  Plan for follow-up in 6 months or sooner if needed.      Relevant Medications   bictegravir-emtricitabine-tenofovir AF (BIKTARVY) 50-200-25 MG TABS tablet   Health care maintenance     Due for routine dental care encouraged to be completed.  Discussed importance of safe sexual practice to reduce risk of STI.  Condoms declined.  Routine breast cancer screening through mammogram up-to-date.  Will be receiving third booster dose of Pfizer vaccine for COVID-19.        Other Visit Diagnoses    Screening for STDs (sexually transmitted diseases)    -  Primary   Pharmacologic therapy           I am having Jamie Cherry maintain her valACYclovir and Biktarvy.   Meds ordered this encounter  Medications  . bictegravir-emtricitabine-tenofovir AF (BIKTARVY) 50-200-25 MG TABS tablet    Sig: Take 1 tablet by mouth daily.    Dispense:  30 tablet    Refill:  5    Order Specific Question:   Supervising Provider    Answer:   Judyann Munson [[id number]]     Follow-up: Return in about 6 months (around 09/07/2020), or if symptoms worsen or fail to improve.   Jamie Eke, MSN, FNP-C Nurse  Practitioner [facility] for Infectious Disease [facility] Main number: [phone]

## 2020-03-09 NOTE — Patient Instructions (Signed)
Nice to see you.   Continue to take your [place] daily as prescribed.  Refills of been sent to the pharmacy.  A prescription has been provided for you to get your lab work completed.  Plan for follow-up in 6 months or sooner if needed.  Have a great day and stay safe!

## 2020-03-09 NOTE — Assessment & Plan Note (Signed)
Jamie Cherry continues to have well-controlled HIV disease with good adherence and tolerance to her ART regimen of Biktarvy.  No signs/symptoms of opportunistic infection or progressive HIV disease.  We reviewed previous lab work and discussed the plan of care.  She will obtain new lab work from [facility].  Continue current dose of [employer].  Plan for follow-up in 6 months or sooner if needed.

## 2020-03-09 NOTE — Assessment & Plan Note (Signed)
   Due for routine dental care encouraged to be completed.  Discussed importance of safe sexual practice to reduce risk of STI.  Condoms declined.  Routine breast cancer screening through mammogram up-to-date.  Will be receiving third booster dose of Pfizer vaccine for COVID-19.

## 2020-03-15 MED FILL — BIKTARVY 50-200-25 MG TABS: 50-200-25 | 30 days supply | Qty: 30 | Fill #4

## 2020-03-23 ENCOUNTER — Telehealth: Payer: Self-pay

## 2020-03-23 NOTE — Telephone Encounter (Signed)
Patient called to review her recent labs. RN does not have access to [facility] and requested provider call to go over results.   Millan Legan Loyola Mast, RN

## 2020-03-24 NOTE — Telephone Encounter (Signed)
Attempted to call and received voicemail. Generic message left that lab work was good and results have been sent to MyChart.

## 2020-04-12 MED FILL — BIKTARVY 50-200-25 MG TABS: 50-200-25 | 30 days supply | Qty: 30 | Fill #5

## 2020-05-03 ENCOUNTER — Other Ambulatory Visit: Payer: Self-pay | Admitting: Family

## 2020-05-03 DIAGNOSIS — B20 Human immunodeficiency virus [HIV] disease: Secondary | ICD-10-CM

## 2020-05-11 MED FILL — BIKTARVY 50-200-25 MG TABS: 50-200-25 | 30 days supply | Qty: 30 | Fill #0

## 2020-07-03 MED FILL — BIKTARVY 50-200-25 MG TABS: 50-200-25 | 30 days supply | Qty: 30 | Fill #1

## 2020-07-26 MED FILL — BIKTARVY 50-200-25 MG TABS: 50-200-25 | 30 days supply | Qty: 30 | Fill #2

## 2020-08-07 IMAGING — MG DIGITAL SCREENING BILATERAL MAMMOGRAM WITH TOMO AND CAD
8 series · 9 of 24 positions shown · non-contrast
Comparison: None.

CLINICAL DATA: Screening.

EXAM:
DIGITAL SCREENING BILATERAL MAMMOGRAM WITH TOMO AND CAD

[L MLO synth-2D]
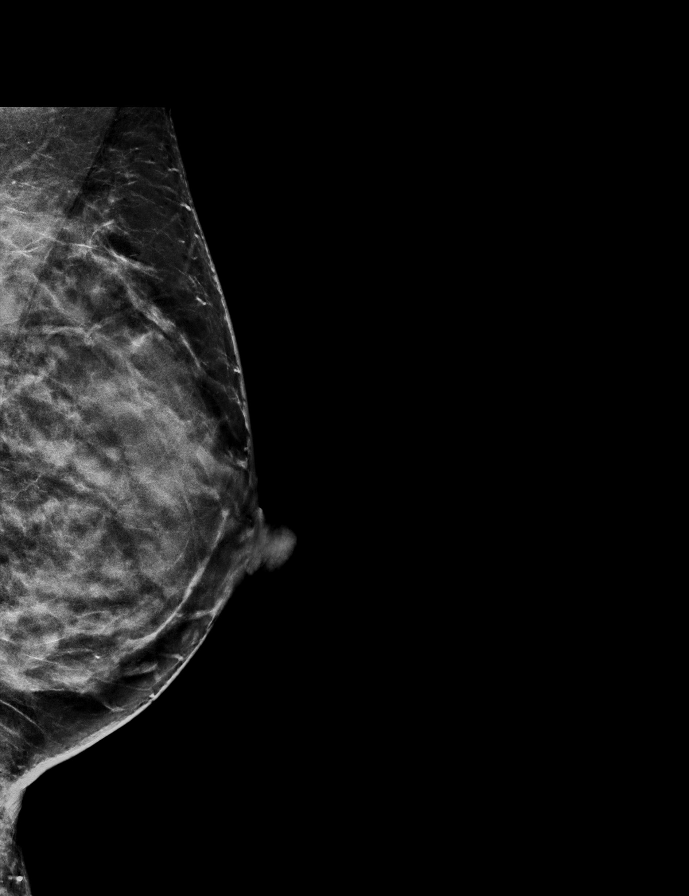

[L CC synth-2D]
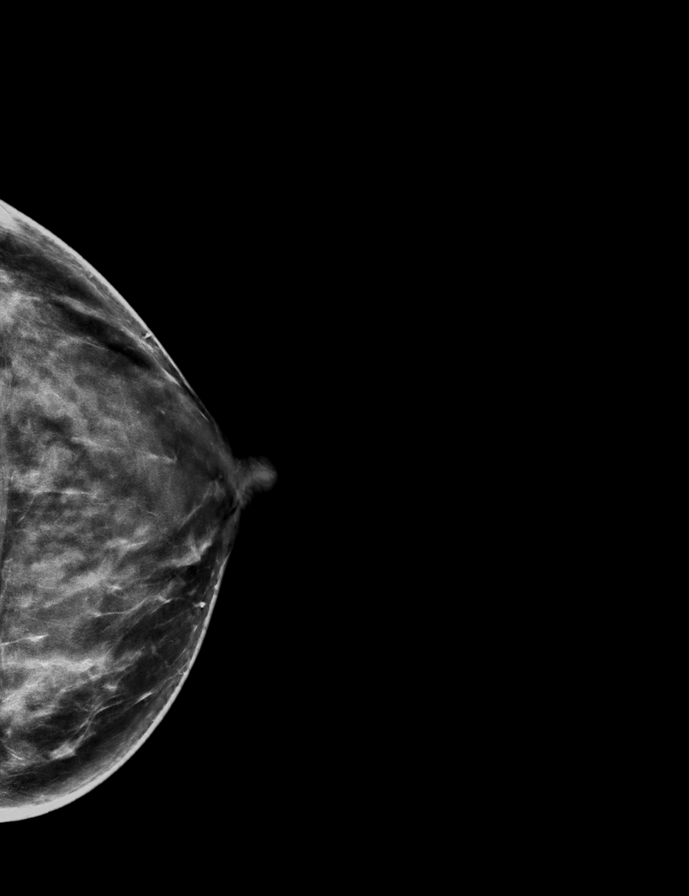

[R CC synth-2D]
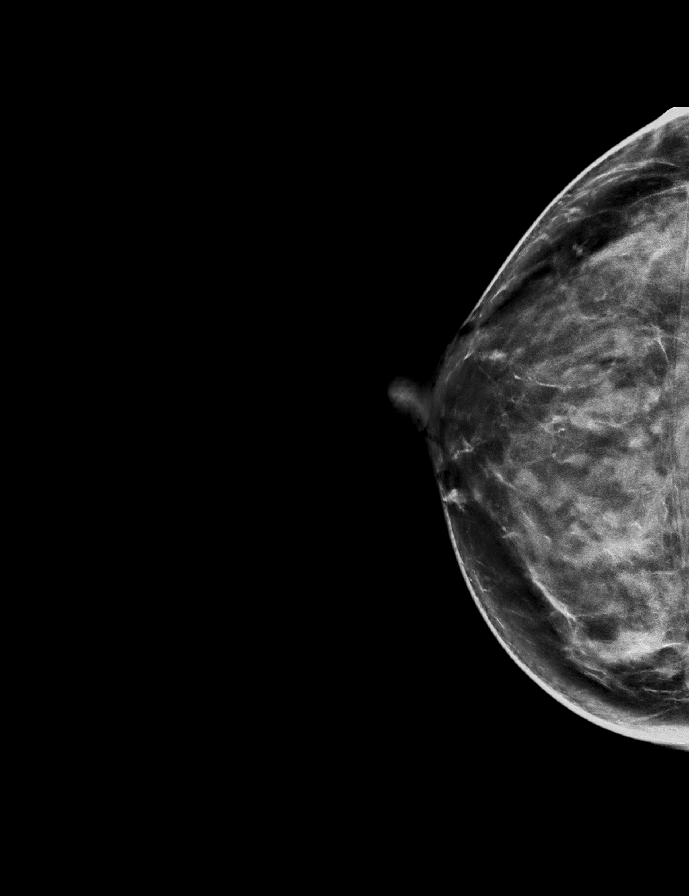

[R MLO synth-2D]
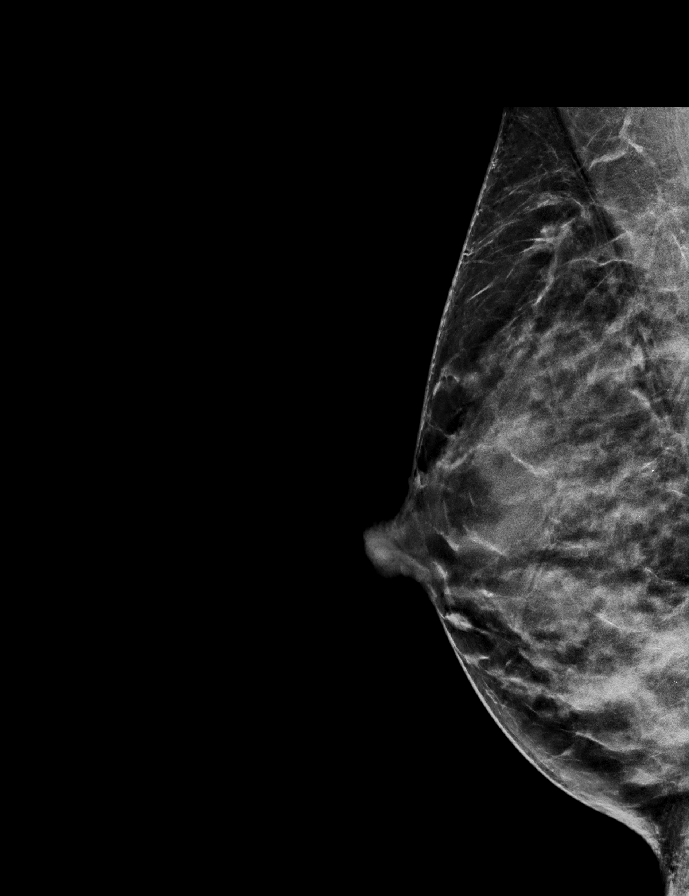

[L CC tomo · 2 of 71 frames shown]
[frame 23/71]
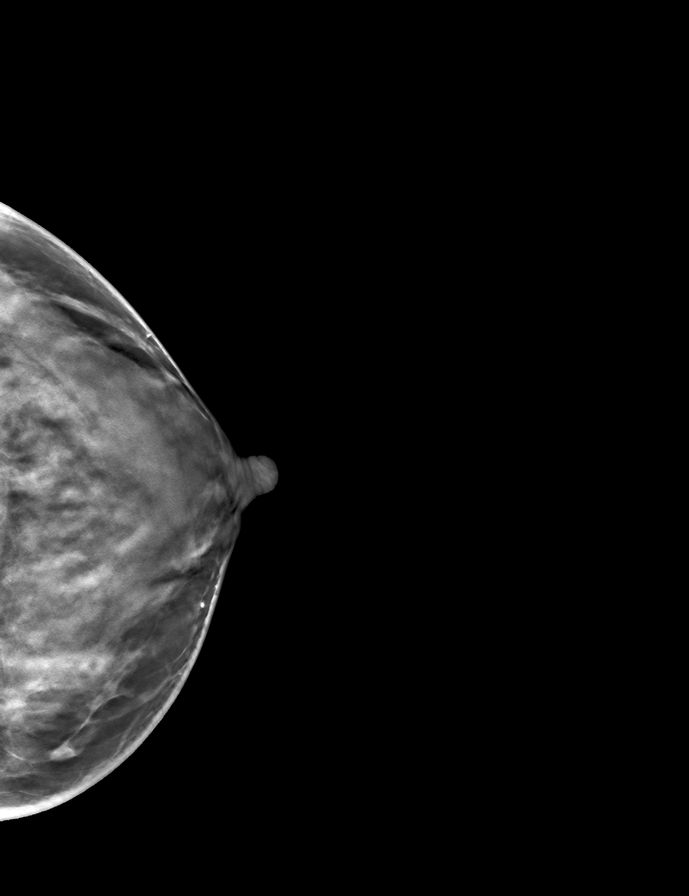
[frame 36/71]
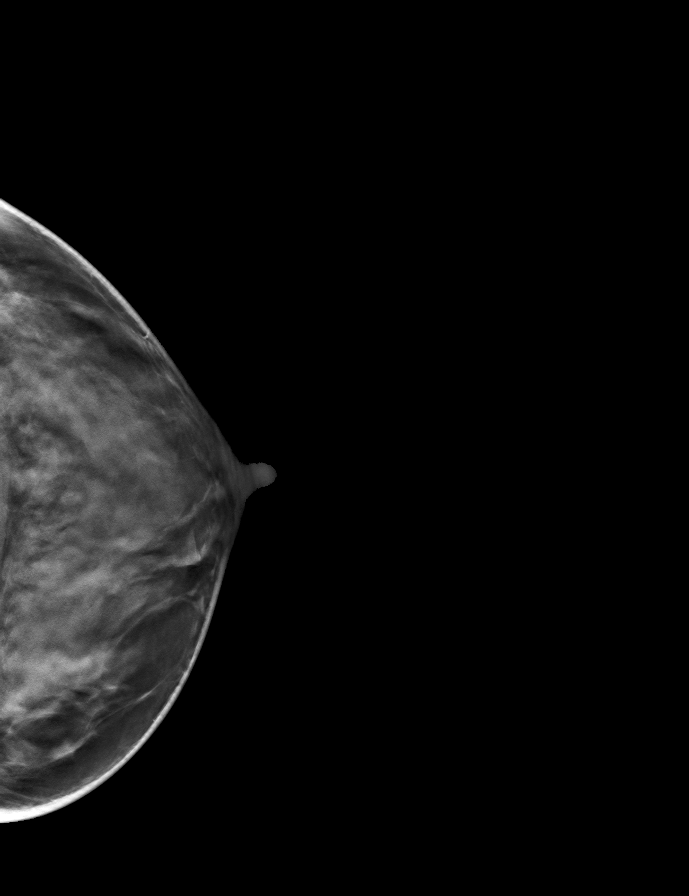

[R MLO tomo · tomo slice 31/60.0]
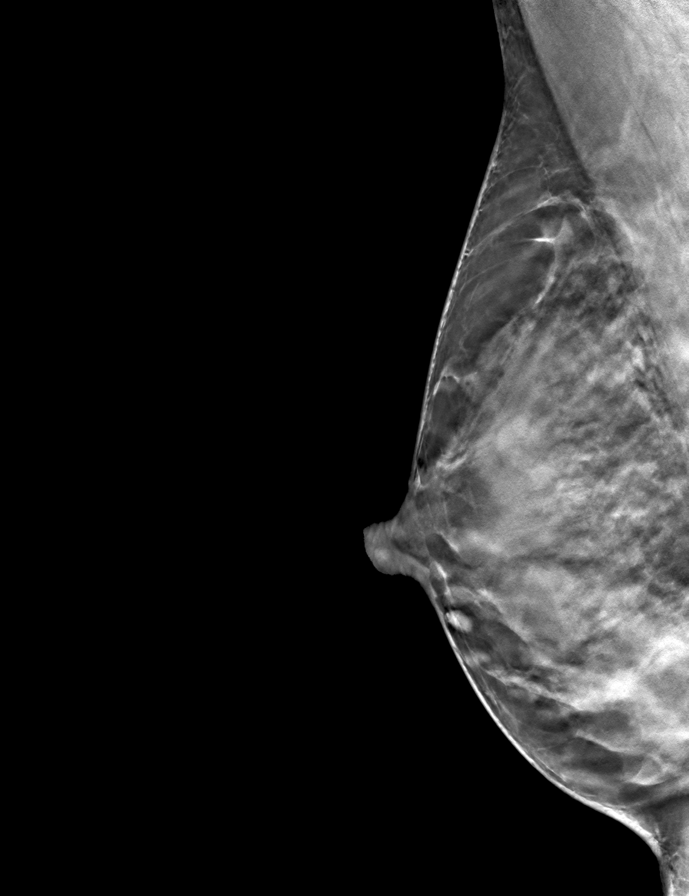

[R CC tomo · tomo slice 39/76.0]
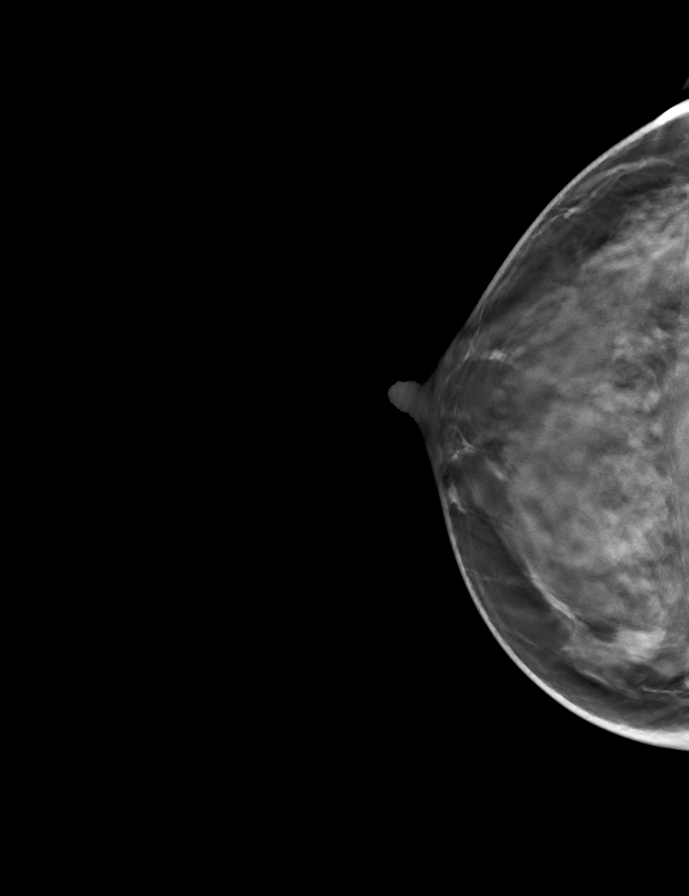

[L MLO tomo · tomo slice 31/60.0]
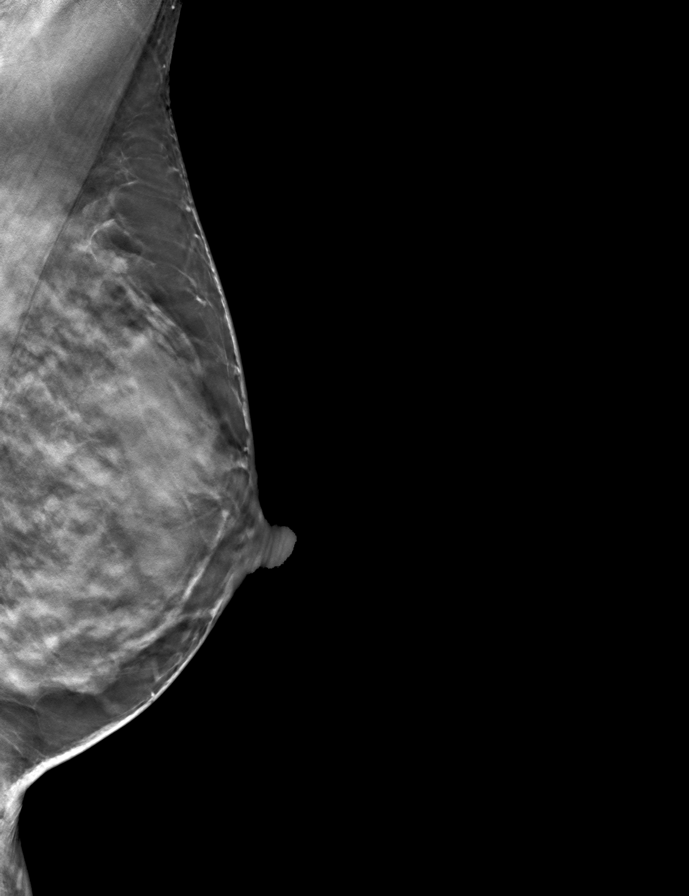

[9 of 24 positions shown; findings below may reference images not displayed]

ACR Breast Density Category d: The breast tissue is extremely dense,
which lowers the sensitivity of mammography.
FINDINGS: There are no findings suspicious for malignancy. Images were
processed with CAD.
IMPRESSION: No mammographic evidence of malignancy. A result letter of this
screening mammogram will be mailed directly to the patient.

RECOMMENDATION:
Screening mammogram in one year. (Code:F8-6-TBV)

BI-RADS CATEGORY  1: Negative.

## 2020-08-25 MED FILL — BIKTARVY 50-200-25 MG TABS: 50-200-25 | 30 days supply | Qty: 30 | Fill #3

## 2020-08-31 ENCOUNTER — Other Ambulatory Visit (HOSPITAL_COMMUNITY): Payer: Self-pay

## 2020-09-14 ENCOUNTER — Telehealth: Payer: Self-pay

## 2020-09-14 ENCOUNTER — Other Ambulatory Visit: Payer: Self-pay

## 2020-09-14 NOTE — Telephone Encounter (Signed)
Written lab orders left up front for pick up by patient on Monday. Patient getting labs drawn at [facility].

## 2020-09-19 ENCOUNTER — Other Ambulatory Visit (HOSPITAL_COMMUNITY): Payer: Self-pay

## 2020-09-19 MED FILL — Bictegravir-Emtricitabine-Tenofovir AF Tab 50-200-25 MG: ORAL | 30 days supply | Qty: 30 | Fill #0 | Status: CN

## 2020-09-21 ENCOUNTER — Other Ambulatory Visit (HOSPITAL_COMMUNITY): Payer: Self-pay

## 2020-09-25 ENCOUNTER — Other Ambulatory Visit: Payer: Self-pay

## 2020-09-25 ENCOUNTER — Ambulatory Visit: Payer: BC Managed Care – PPO | Admitting: Family

## 2020-09-25 ENCOUNTER — Other Ambulatory Visit (HOSPITAL_COMMUNITY): Payer: Self-pay

## 2020-09-25 ENCOUNTER — Encounter: Payer: Self-pay | Admitting: Family

## 2020-09-25 ENCOUNTER — Other Ambulatory Visit (HOSPITAL_BASED_OUTPATIENT_CLINIC_OR_DEPARTMENT_OTHER): Payer: Self-pay

## 2020-09-25 ENCOUNTER — Telehealth: Payer: Self-pay

## 2020-09-25 DIAGNOSIS — B20 Human immunodeficiency virus [HIV] disease: Secondary | ICD-10-CM

## 2020-09-25 DIAGNOSIS — Z0000 Encounter for general adult medical examination without abnormal findings: Secondary | ICD-10-CM

## 2020-09-25 MED ORDER — VALACYCLOVIR HCL 1 G PO TABS
ORAL_TABLET | ORAL | 1 refills | Status: AC
  Filled 2020-09-25: qty 30, fill #0
  Filled 2020-09-25: qty 30, 15d supply, fill #0
  Filled 2020-10-20: qty 30, 15d supply, fill #1

## 2020-09-25 MED ORDER — BIKTARVY 50-200-25 MG PO TABS
1.0000 | ORAL_TABLET | Freq: Every day | ORAL | 5 refills | Status: DC
  Filled 2020-09-25 (×2): qty 30, 30d supply, fill #0
  Filled 2020-10-20: qty 30, 30d supply, fill #1
  Filled 2020-11-27: qty 30, 30d supply, fill #2
  Filled 2020-12-20: qty 30, 30d supply, fill #3
  Filled 2021-01-23: qty 30, 30d supply, fill #4
  Filled 2021-02-20: qty 30, 30d supply, fill #5

## 2020-09-25 NOTE — Telephone Encounter (Signed)
Patient called and states she is at [facility] trying to pick up her Biktarvy, but that they are telling her it has been discontinued. RN called [facility], they state that it looks like it is in the process of being verified and filled. RN attempted to call patient back to let her know, no answer.   Sandie Ano, RN

## 2020-09-25 NOTE — Progress Notes (Signed)
Brief Narrative   Patient ID: Jamie Cherry, female    DOB: [birth year], 55 y.o.   MRN: [id number]    Subjective:    Chief Complaint  Patient presents with  . Follow-up    B20 condoms offered and declined      HPI:  Jamie Cherry is a 55 y.o. female with HIV disease last seen on 05/09/2020 with well-controlled virus and good adherence and tolerance to her ART regimen of Biktarvy.  Viral load at the time was undetectable and CD4 count was 999.  Here today for routine follow-up.  Jamie Cherry continues to take her Biktarvy daily as prescribed with no adverse side effects or missed doses since her last office visit.  Overall feeling well today with no new concerns/complaints. Denies fevers, chills, night sweats, headaches, changes in vision, neck pain/stiffness, nausea, diarrhea, vomiting, lesions or rashes.  Jamie Cherry has no problems obtaining her medications from the pharmacy.  Denies feelings of being down, depressed, or hopeless recently.  No recreational or illicit drug use, tobacco use, or alcohol consumption.  Routine dental care is up-to-date per recommendations.  Scheduled for her annual exam.  Declines condoms.   Allergies  Allergen Reactions  . Vicodin [Hydrocodone-Acetaminophen]     Nausea and vomiting      Outpatient Medications Prior to Visit  Medication Sig Dispense Refill  . bictegravir-emtricitabine-tenofovir AF (BIKTARVY) 50-200-25 MG TABS tablet Take 1 tablet by mouth daily. 30 tablet 5  . valACYclovir (VALTREX) 1000 MG tablet Take 1,000 mg by mouth 2 (two) times daily.    . valACYclovir (VALTREX) 500 MG tablet Take 500 mg by mouth 2 (two) times daily.    . bictegravir-emtricitabine-tenofovir AF (BIKTARVY) 50-200-25 MG TABS tablet TAKE 1 TABLET BY MOUTH DAILY. 30 tablet 5  . valACYclovir (VALTREX) 1000 MG tablet Take 1 tablet by mouth twice daily for 7 days as needed for outbreaks (Patient not taking: Reported on 03/09/2020) 30 tablet 1   No  facility-administered medications prior to visit.     Past Medical History:  Diagnosis Date  . HIV infection [facility])      Past Surgical History:  Procedure Laterality Date  . BREAST BIOPSY Left 2010   neg  . MANDIBLE SURGERY    . UTERINE FIBROID SURGERY  2014     Review of Systems  Constitutional: Negative for appetite change, chills, diaphoresis, fatigue, fever and unexpected weight change.  Eyes:       Negative for acute change in vision  Respiratory: Negative for chest tightness, shortness of breath and wheezing.   Cardiovascular: Negative for chest pain.  Gastrointestinal: Negative for diarrhea, nausea and vomiting.  Genitourinary: Negative for dysuria, pelvic pain and vaginal discharge.  Musculoskeletal: Negative for neck pain and neck stiffness.  Skin: Negative for rash.  Neurological: Negative for seizures, syncope, weakness and headaches.  Hematological: Negative for adenopathy. Does not bruise/bleed easily.  Psychiatric/Behavioral: Negative for hallucinations.      Objective:    BP 131/84   Pulse 64   Resp 16   Ht 5\' 3"  (1.6 m)   Wt 154 lb (69.9 kg)   LMP 03/03/2018 Comment: Irregular periods  SpO2 99%   BMI 27.28 kg/m  Nursing note and vital signs reviewed.  Physical Exam Constitutional:      General: She is not in acute distress.    Appearance: She is well-developed.  Eyes:     Conjunctiva/sclera: Conjunctivae normal.  Cardiovascular:     Rate and Rhythm: Normal rate and  regular rhythm.     Heart sounds: Normal heart sounds. No murmur heard. No friction rub. No gallop.   Pulmonary:     Effort: Pulmonary effort is normal. No respiratory distress.     Breath sounds: Normal breath sounds. No wheezing or rales.  Chest:     Chest wall: No tenderness.  Abdominal:     General: Bowel sounds are normal.     Palpations: Abdomen is soft.     Tenderness: There is no abdominal tenderness.  Musculoskeletal:     Cervical back: Neck supple.   Lymphadenopathy:     Cervical: No cervical adenopathy.  Skin:    General: Skin is warm and dry.     Findings: No rash.  Neurological:     Mental Status: She is alert and oriented to person, place, and time.  Psychiatric:        Behavior: Behavior normal.        Thought Content: Thought content normal.        Judgment: Judgment normal.      Depression screen [facility] 2/9 09/25/2020 03/09/2020 08/06/2019 06/26/2018 03/06/2018  Decreased Interest 0 0 0 0 0  Down, Depressed, Hopeless 0 0 0 0 0  PHQ - 2 Score 0 0 0 0 0       Assessment & Plan:    Patient Active Problem List   Diagnosis Date Noted  . Cough 06/26/2018  . Health care maintenance 06/26/2018  . HIV disease (HCC) 03/06/2018     Problem List Items Addressed This Visit      Other   HIV disease (HCC)   Relevant Medications   valACYclovir (VALTREX) 1000 MG tablet   bictegravir-emtricitabine-tenofovir AF (BIKTARVY) 50-200-25 MG TABS tablet       I have discontinued Leaira Requena's bictegravir-emtricitabine-tenofovir AF. I am also having her maintain her valACYclovir and Biktarvy.   Meds ordered this encounter  Medications  . valACYclovir (VALTREX) 1000 MG tablet    Sig: Take 1 tablet by mouth twice daily for 7 days as needed for outbreaks    Dispense:  30 tablet    Refill:  1    Order Specific Question:   Supervising Provider    Answer:   Judyann Munson [[id number]]  . bictegravir-emtricitabine-tenofovir AF (BIKTARVY) 50-200-25 MG TABS tablet    Sig: Take 1 tablet by mouth daily.    Dispense:  30 tablet    Refill:  5    Order Specific Question:   Supervising Provider    Answer:   Judyann Munson [[id number]]     Follow-up: No follow-ups on file.   Marcos Eke, MSN, FNP-C Nurse Practitioner [facility] for Infectious Disease [facility] Main number: [phone]

## 2020-09-25 NOTE — Patient Instructions (Addendum)
Nice to see you.  We will check your lab work.  Continue to take your medications.   Plan for follow up in 6 months or sooner if needed. With lab work through [facility].   Have a great day and stay safe!

## 2020-09-25 NOTE — Assessment & Plan Note (Signed)
Jamie Cherry continues to have well-controlled virus with good adherence and tolerance to her ART regimen of Biktarvy.  No signs/symptoms of opportunistic infection or progressive HIV.  We reviewed lab work and discussed plan of care.  Continue current dose of Biktarvy.  Check blood work today through [facility].  Plan for follow-up in 6 months or sooner if needed.

## 2020-09-25 NOTE — Assessment & Plan Note (Signed)
   Discussed importance of safe sexual practice to reduce risk of STI.  Condoms declined.  Scheduled for routine annual exam which is upcoming.  Routine dental care is up-to-date per recommendations.

## 2020-10-18 ENCOUNTER — Other Ambulatory Visit (HOSPITAL_COMMUNITY): Payer: Self-pay

## 2020-10-20 ENCOUNTER — Other Ambulatory Visit (HOSPITAL_COMMUNITY): Payer: Self-pay

## 2020-10-21 ENCOUNTER — Other Ambulatory Visit (HOSPITAL_COMMUNITY): Payer: Self-pay

## 2020-10-22 ENCOUNTER — Other Ambulatory Visit (HOSPITAL_COMMUNITY): Payer: Self-pay

## 2020-10-26 ENCOUNTER — Other Ambulatory Visit (HOSPITAL_COMMUNITY): Payer: Self-pay

## 2020-10-31 ENCOUNTER — Other Ambulatory Visit (HOSPITAL_COMMUNITY): Payer: Self-pay

## 2020-11-23 ENCOUNTER — Other Ambulatory Visit (HOSPITAL_COMMUNITY): Payer: Self-pay

## 2020-11-27 ENCOUNTER — Other Ambulatory Visit (HOSPITAL_COMMUNITY): Payer: Self-pay

## 2020-12-20 ENCOUNTER — Other Ambulatory Visit (HOSPITAL_COMMUNITY): Payer: Self-pay

## 2020-12-28 ENCOUNTER — Other Ambulatory Visit (HOSPITAL_COMMUNITY): Payer: Self-pay

## 2021-01-16 ENCOUNTER — Other Ambulatory Visit (HOSPITAL_COMMUNITY): Payer: Self-pay

## 2021-01-23 ENCOUNTER — Other Ambulatory Visit (HOSPITAL_COMMUNITY): Payer: Self-pay

## 2021-01-29 ENCOUNTER — Other Ambulatory Visit (HOSPITAL_COMMUNITY): Payer: Self-pay

## 2021-01-29 ENCOUNTER — Other Ambulatory Visit: Payer: Self-pay | Admitting: Internal Medicine

## 2021-01-29 DIAGNOSIS — Z1231 Encounter for screening mammogram for malignant neoplasm of breast: Secondary | ICD-10-CM

## 2021-02-15 ENCOUNTER — Other Ambulatory Visit: Payer: Self-pay

## 2021-02-15 ENCOUNTER — Ambulatory Visit
Admission: RE | Admit: 2021-02-15 | Discharge: 2021-02-15 | Disposition: A | Discharge status: Home / Self Care | Payer: BC Managed Care – PPO | Source: Ambulatory Visit | Attending: Internal Medicine | Admitting: Internal Medicine

## 2021-02-15 DIAGNOSIS — Z1231 Encounter for screening mammogram for malignant neoplasm of breast: Secondary | ICD-10-CM | POA: Diagnosis not present

## 2021-02-20 ENCOUNTER — Other Ambulatory Visit (HOSPITAL_COMMUNITY): Payer: Self-pay

## 2021-02-21 ENCOUNTER — Other Ambulatory Visit: Payer: Self-pay | Admitting: Internal Medicine

## 2021-02-26 ENCOUNTER — Other Ambulatory Visit (HOSPITAL_COMMUNITY): Payer: Self-pay

## 2021-02-27 ENCOUNTER — Other Ambulatory Visit: Payer: Self-pay | Admitting: Internal Medicine

## 2021-02-27 DIAGNOSIS — N6320 Unspecified lump in the left breast, unspecified quadrant: Secondary | ICD-10-CM

## 2021-02-27 DIAGNOSIS — R928 Other abnormal and inconclusive findings on diagnostic imaging of breast: Secondary | ICD-10-CM

## 2021-03-02 ENCOUNTER — Other Ambulatory Visit: Payer: Self-pay

## 2021-03-02 ENCOUNTER — Ambulatory Visit
Admission: RE | Admit: 2021-03-02 | Discharge: 2021-03-02 | Disposition: A | Discharge status: Home / Self Care | Payer: BC Managed Care – PPO | Source: Ambulatory Visit | Attending: Internal Medicine | Admitting: Internal Medicine

## 2021-03-02 DIAGNOSIS — N6320 Unspecified lump in the left breast, unspecified quadrant: Secondary | ICD-10-CM | POA: Insufficient documentation

## 2021-03-02 DIAGNOSIS — R928 Other abnormal and inconclusive findings on diagnostic imaging of breast: Secondary | ICD-10-CM | POA: Diagnosis present

## 2021-03-05 ENCOUNTER — Encounter: Payer: Self-pay | Admitting: Family

## 2021-03-05 ENCOUNTER — Other Ambulatory Visit: Payer: Self-pay

## 2021-03-05 ENCOUNTER — Ambulatory Visit: Payer: BC Managed Care – PPO | Admitting: Family

## 2021-03-05 ENCOUNTER — Other Ambulatory Visit (HOSPITAL_COMMUNITY): Payer: Self-pay

## 2021-03-05 VITALS — BP 122/75 | HR 73 | Temp 97.8°F | Wt 154.0 lb

## 2021-03-05 DIAGNOSIS — B20 Human immunodeficiency virus [HIV] disease: Secondary | ICD-10-CM | POA: Diagnosis not present

## 2021-03-05 DIAGNOSIS — Z0000 Encounter for general adult medical examination without abnormal findings: Secondary | ICD-10-CM | POA: Diagnosis not present

## 2021-03-05 MED ORDER — BIKTARVY 50-200-25 MG PO TABS
1.0000 | ORAL_TABLET | Freq: Every day | ORAL | 6 refills | Status: DC
  Filled 2021-03-05 – 2021-04-09 (×2): qty 30, 30d supply, fill #0
  Filled 2021-05-10: qty 30, 30d supply, fill #1

## 2021-03-05 NOTE — Assessment & Plan Note (Signed)
Jamie Cherry continues to have well-controlled virus with good adherence and tolerance to her ART regimen with Biktarvy.  No signs/symptoms of opportunistic infection.  We reviewed previous lab work and discussed plan of care.  Continue current dose of Biktarvy.  Check blood work through [facility].  Plan for follow-up in 6 months or sooner if needed.

## 2021-03-05 NOTE — Assessment & Plan Note (Signed)
   Discussed importance of safe sexual practice and condom usage.  Condoms offered and declined.  Discussed/recommended COVID-vaccine.  Discussed Monkey Pox and does not have any increased risk factors at this time.  Routine dental care up-to-date per recommendations.

## 2021-03-05 NOTE — Patient Instructions (Addendum)
Nice to see you.  We will check your lab work through [facility].   Continue to take your medications daily.  Refills have been sent to the pharmacy.  Plan for follow up in 6 months or sooner if needed.   Have a great day and stay safe!

## 2021-03-05 NOTE — Progress Notes (Signed)
Brief Narrative   Patient ID: Jamie Cherry, female    DOB: 1965/09/12, 55 y.o.   MRN: [id number]  Jamie Cherry is a 55 y/o AA female diagnosed with HIV disease in 2000 with risk factor of heterosexual contact. Initial CD4 count and viral load are unknown. [id number] negative and no history of opportunistic infection. Previous ART history with Sustiva/Truvada and Atripla.    Subjective:    No chief complaint on file.   HPI:  Jamie Cherry is a 55 y.o. female with HIV disease last seen on 09/25/2020 with well-controlled virus and good adherence and tolerance to her ART regimen of Biktarvy.  Viral load was undetectable with CD4 count of 929.  Here today for routine follow-up.  Jamie Cherry continues to take her Biktarvy daily as prescribed with no adverse side effects.  Overall feeling well today with no new concerns/complaints.   Denies fevers, chills, night sweats, headaches, changes in vision, neck pain/stiffness, nausea, diarrhea, vomiting, lesions or rashes.  Jamie Cherry has no problems obtaining medication from the pharmacy.  Denies feelings of being down, depressed, or hopeless recently.  No current recreational illicit drug use, tobacco use, or alcohol consumption.  Condoms offered and declined. Routine dental care is up to date. Has questions about Monkey Pox and Covid vaccination.   Allergies  Allergen Reactions   Vicodin [Hydrocodone-Acetaminophen]     Nausea and vomiting      Outpatient Medications Prior to Visit  Medication Sig Dispense Refill   valACYclovir (VALTREX) 1000 MG tablet Take 1 tablet by mouth twice daily for 7 days as needed for outbreaks 30 tablet 1   bictegravir-emtricitabine-tenofovir AF (BIKTARVY) 50-200-25 MG TABS tablet Take 1 tablet by mouth daily. 30 tablet 5   No facility-administered medications prior to visit.     Past Medical History:  Diagnosis Date   HIV infection (HCC)      Past Surgical History:  Procedure Laterality Date   BREAST  BIOPSY Left 2010   neg   MANDIBLE SURGERY     UTERINE FIBROID SURGERY  2014      Review of Systems  Constitutional:  Negative for appetite change, chills, diaphoresis, fatigue, fever and unexpected weight change.  Eyes:        Negative for acute change in vision  Respiratory:  Negative for chest tightness, shortness of breath and wheezing.   Cardiovascular:  Negative for chest pain.  Gastrointestinal:  Negative for diarrhea, nausea and vomiting.  Genitourinary:  Negative for dysuria, pelvic pain and vaginal discharge.  Musculoskeletal:  Negative for neck pain and neck stiffness.  Skin:  Negative for rash.  Neurological:  Negative for seizures, syncope, weakness and headaches.  Hematological:  Negative for adenopathy. Does not bruise/bleed easily.  Psychiatric/Behavioral:  Negative for hallucinations.      Objective:    BP 122/75   Pulse 73   Temp 97.8 F (36.6 C) (Oral)   Wt 154 lb (69.9 kg)   LMP 03/03/2018 Comment: Irregular periods  BMI 27.28 kg/m  Nursing note and vital signs reviewed.  Physical Exam Constitutional:      General: She is not in acute distress.    Appearance: She is well-developed.  Eyes:     Conjunctiva/sclera: Conjunctivae normal.  Cardiovascular:     Rate and Rhythm: Normal rate and regular rhythm.     Heart sounds: Normal heart sounds. No murmur heard.   No friction rub. No gallop.  Pulmonary:     Effort: Pulmonary effort is normal. No  respiratory distress.     Breath sounds: Normal breath sounds. No wheezing or rales.  Chest:     Chest wall: No tenderness.  Abdominal:     General: Bowel sounds are normal.     Palpations: Abdomen is soft.     Tenderness: There is no abdominal tenderness.  Musculoskeletal:     Cervical back: Neck supple.  Lymphadenopathy:     Cervical: No cervical adenopathy.  Skin:    General: Skin is warm and dry.     Findings: No rash.  Neurological:     Mental Status: She is alert and oriented to person, place,  and time.  Psychiatric:        Behavior: Behavior normal.        Thought Content: Thought content normal.        Judgment: Judgment normal.     Depression screen [facility] 2/9 03/05/2021 09/25/2020 03/09/2020 08/06/2019 06/26/2018  Decreased Interest 0 0 0 0 0  Down, Depressed, Hopeless 0 0 0 0 0  PHQ - 2 Score 0 0 0 0 0       Assessment & Plan:    Patient Active Problem List   Diagnosis Date Noted   Cough 06/26/2018   Health care maintenance 06/26/2018   HIV disease (HCC) 03/06/2018     Problem List Items Addressed This Visit       Other   HIV disease (HCC)    Jamie Cherry continues to have well-controlled virus with good adherence and tolerance to her ART regimen with Biktarvy.  No signs/symptoms of opportunistic infection.  We reviewed previous lab work and discussed plan of care.  Continue current dose of Biktarvy.  Check blood work through [employer] [facility].  Plan for follow-up in 6 months or sooner if needed.      Relevant Medications   bictegravir-emtricitabine-tenofovir AF (BIKTARVY) 50-200-25 MG TABS tablet   Health care maintenance - Primary    Discussed importance of safe sexual practice and condom usage.  Condoms offered and declined. Discussed/recommended COVID-vaccine. Discussed Monkey Pox and does not have any increased risk factors at this time. Routine dental care up-to-date per recommendations.        I am having [facility] maintain her valACYclovir and Biktarvy.   Meds ordered this encounter  Medications   bictegravir-emtricitabine-tenofovir AF (BIKTARVY) 50-200-25 MG TABS tablet    Sig: Take 1 tablet by mouth daily.    Dispense:  30 tablet    Refill:  6    Order Specific Question:   Supervising Provider    Answer:   Judyann Munson [[id number]]     Follow-up: Return in about 6 months (around 09/03/2021), or if symptoms worsen or fail to improve.   Marcos Eke, MSN, FNP-C Nurse Practitioner [facility] for Infectious Disease [facility]  Main number: [phone]

## 2021-03-26 ENCOUNTER — Other Ambulatory Visit (HOSPITAL_COMMUNITY): Payer: Self-pay

## 2021-03-30 ENCOUNTER — Other Ambulatory Visit (HOSPITAL_COMMUNITY): Payer: Self-pay

## 2021-04-05 ENCOUNTER — Ambulatory Visit: Payer: BC Managed Care – PPO | Admitting: Family

## 2021-04-09 ENCOUNTER — Other Ambulatory Visit (HOSPITAL_COMMUNITY): Payer: Self-pay

## 2021-04-10 ENCOUNTER — Ambulatory Visit: Payer: BC Managed Care – PPO | Admitting: Family

## 2021-05-03 ENCOUNTER — Other Ambulatory Visit (HOSPITAL_COMMUNITY): Payer: Self-pay

## 2021-05-10 ENCOUNTER — Other Ambulatory Visit (HOSPITAL_COMMUNITY): Payer: Self-pay

## 2021-05-15 ENCOUNTER — Other Ambulatory Visit: Payer: Self-pay

## 2021-06-04 ENCOUNTER — Other Ambulatory Visit (HOSPITAL_COMMUNITY): Payer: Self-pay

## 2021-06-08 ENCOUNTER — Other Ambulatory Visit (HOSPITAL_COMMUNITY): Payer: Self-pay

## 2021-11-12 ENCOUNTER — Other Ambulatory Visit: Payer: Self-pay | Admitting: Family

## 2021-11-12 DIAGNOSIS — B20 Human immunodeficiency virus [HIV] disease: Secondary | ICD-10-CM

## 2022-01-04 ENCOUNTER — Other Ambulatory Visit: Payer: Self-pay | Admitting: Family

## 2022-01-04 ENCOUNTER — Telehealth: Payer: Self-pay

## 2022-01-04 DIAGNOSIS — B20 Human immunodeficiency virus [HIV] disease: Secondary | ICD-10-CM

## 2022-01-04 NOTE — Telephone Encounter (Signed)
Called patient after receiving refill request for Biktarvy. States that she has moved and transferred care to local provider. Will deny refill and request pharmacy follow up with new ID team.   Hanley Hays, MD   [address]   [address]   [place], [place] [address]   Phone: [phone]   Fax: [phone]    Juanita Laster, RMA
